# Patient Record
Sex: Female | Born: 1980 | Race: White | Hispanic: No | Marital: Married | Smoking: Former smoker
Health system: Southern US, Community
[De-identification: ages and names within clinical notes are randomized; demographics above are authoritative.]

## PROBLEM LIST (undated history)

## (undated) DIAGNOSIS — D649 Anemia, unspecified: Secondary | ICD-10-CM

## (undated) DIAGNOSIS — E039 Hypothyroidism, unspecified: Secondary | ICD-10-CM

## (undated) DIAGNOSIS — F419 Anxiety disorder, unspecified: Secondary | ICD-10-CM

## (undated) DIAGNOSIS — D563 Thalassemia minor: Secondary | ICD-10-CM

## (undated) HISTORY — PX: AUGMENTATION MAMMAPLASTY: SUR837

## (undated) HISTORY — DX: Anxiety disorder, unspecified: F41.9

## (undated) HISTORY — PX: BREAST SURGERY: SHX581

## (undated) HISTORY — DX: Anemia, unspecified: D64.9

---
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
## 2003-02-17 ENCOUNTER — Other Ambulatory Visit: Admission: RE | Admit: 2003-02-17 | Discharge: 2003-02-17 | Payer: Self-pay | Admitting: Obstetrics and Gynecology

## 2004-02-22 ENCOUNTER — Other Ambulatory Visit: Admission: RE | Admit: 2004-02-22 | Discharge: 2004-02-22 | Payer: Self-pay | Admitting: Obstetrics and Gynecology

## 2004-07-09 ENCOUNTER — Encounter: Admission: RE | Admit: 2004-07-09 | Discharge: 2004-07-09 | Payer: Self-pay | Admitting: Internal Medicine

## 2005-02-12 ENCOUNTER — Ambulatory Visit (HOSPITAL_COMMUNITY): Admission: RE | Admit: 2005-02-12 | Discharge: 2005-02-12 | Payer: Self-pay | Admitting: Obstetrics and Gynecology

## 2005-02-26 ENCOUNTER — Ambulatory Visit (HOSPITAL_COMMUNITY): Admission: RE | Admit: 2005-02-26 | Discharge: 2005-02-26 | Payer: Self-pay | Admitting: Obstetrics and Gynecology

## 2005-05-04 ENCOUNTER — Inpatient Hospital Stay (HOSPITAL_COMMUNITY): Admission: AD | Admit: 2005-05-04 | Discharge: 2005-05-06 | Payer: Self-pay | Admitting: Obstetrics and Gynecology

## 2005-06-12 ENCOUNTER — Other Ambulatory Visit: Admission: RE | Admit: 2005-06-12 | Discharge: 2005-06-12 | Payer: Self-pay | Admitting: Obstetrics and Gynecology

## 2005-08-08 ENCOUNTER — Ambulatory Visit: Admission: RE | Admit: 2005-08-08 | Discharge: 2005-08-08 | Payer: Self-pay | Admitting: Obstetrics and Gynecology

## 2006-06-19 ENCOUNTER — Other Ambulatory Visit: Admission: RE | Admit: 2006-06-19 | Discharge: 2006-06-19 | Payer: Self-pay | Admitting: Obstetrics and Gynecology

## 2007-04-10 ENCOUNTER — Ambulatory Visit (HOSPITAL_COMMUNITY): Admission: RE | Admit: 2007-04-10 | Discharge: 2007-04-10 | Payer: Self-pay | Admitting: Obstetrics and Gynecology

## 2007-11-11 ENCOUNTER — Inpatient Hospital Stay (HOSPITAL_COMMUNITY): Admission: AD | Admit: 2007-11-11 | Discharge: 2007-11-12 | Payer: Self-pay | Admitting: Obstetrics and Gynecology

## 2007-11-17 ENCOUNTER — Ambulatory Visit: Admission: RE | Admit: 2007-11-17 | Discharge: 2007-11-17 | Payer: Self-pay | Admitting: Obstetrics and Gynecology

## 2007-12-04 ENCOUNTER — Encounter: Admission: RE | Admit: 2007-12-04 | Discharge: 2007-12-04 | Payer: Self-pay | Admitting: Obstetrics and Gynecology

## 2008-08-18 ENCOUNTER — Ambulatory Visit: Payer: Self-pay | Admitting: Internal Medicine

## 2008-08-29 ENCOUNTER — Encounter: Admission: RE | Admit: 2008-08-29 | Discharge: 2008-08-29 | Payer: Self-pay | Admitting: Internal Medicine

## 2008-08-29 ENCOUNTER — Ambulatory Visit: Payer: Self-pay | Admitting: Internal Medicine

## 2008-09-19 ENCOUNTER — Ambulatory Visit: Payer: Self-pay | Admitting: Internal Medicine

## 2008-11-14 ENCOUNTER — Ambulatory Visit: Payer: Self-pay | Admitting: Internal Medicine

## 2008-11-25 ENCOUNTER — Ambulatory Visit: Payer: Self-pay | Admitting: Internal Medicine

## 2009-04-18 ENCOUNTER — Ambulatory Visit: Payer: Self-pay | Admitting: Internal Medicine

## 2009-04-25 ENCOUNTER — Ambulatory Visit: Payer: Self-pay | Admitting: Internal Medicine

## 2009-05-29 ENCOUNTER — Ambulatory Visit: Payer: Self-pay | Admitting: Internal Medicine

## 2009-08-30 ENCOUNTER — Encounter: Admission: RE | Admit: 2009-08-30 | Discharge: 2009-08-30 | Payer: Self-pay | Admitting: Obstetrics and Gynecology

## 2009-09-01 ENCOUNTER — Ambulatory Visit: Payer: Self-pay | Admitting: Internal Medicine

## 2009-12-01 ENCOUNTER — Ambulatory Visit: Payer: Self-pay | Admitting: Internal Medicine

## 2010-01-08 ENCOUNTER — Ambulatory Visit: Payer: Self-pay | Admitting: Internal Medicine

## 2010-02-08 ENCOUNTER — Ambulatory Visit: Payer: Self-pay | Admitting: Internal Medicine

## 2010-02-20 ENCOUNTER — Ambulatory Visit: Payer: Self-pay | Admitting: Internal Medicine

## 2010-03-08 ENCOUNTER — Ambulatory Visit: Payer: Self-pay | Admitting: Internal Medicine

## 2010-06-01 ENCOUNTER — Ambulatory Visit: Payer: Self-pay | Admitting: Internal Medicine

## 2010-07-23 ENCOUNTER — Ambulatory Visit: Payer: Self-pay | Admitting: Internal Medicine

## 2010-07-30 ENCOUNTER — Ambulatory Visit: Payer: Self-pay | Admitting: Internal Medicine

## 2010-10-15 ENCOUNTER — Ambulatory Visit
Admission: RE | Admit: 2010-10-15 | Discharge: 2010-10-15 | Payer: Self-pay | Source: Home / Self Care | Attending: Internal Medicine | Admitting: Internal Medicine

## 2010-10-17 ENCOUNTER — Ambulatory Visit (HOSPITAL_COMMUNITY)
Admission: RE | Admit: 2010-10-17 | Discharge: 2010-10-17 | Payer: Self-pay | Source: Home / Self Care | Attending: Internal Medicine | Admitting: Internal Medicine

## 2011-01-29 ENCOUNTER — Ambulatory Visit (INDEPENDENT_AMBULATORY_CARE_PROVIDER_SITE_OTHER): Payer: BC Managed Care – PPO | Admitting: Internal Medicine

## 2011-01-29 DIAGNOSIS — E039 Hypothyroidism, unspecified: Secondary | ICD-10-CM

## 2011-01-29 DIAGNOSIS — J069 Acute upper respiratory infection, unspecified: Secondary | ICD-10-CM

## 2011-02-12 NOTE — H&P (Signed)
NAMEARICKA, GOLDBERGER              ACCOUNT NO.:  [id number]   MEDICAL RECORD NO.:  [id number]          PATIENT TYPE:  INP   LOCATION:  9131                          FACILITY:  [facility]   PHYSICIAN:  Naima A. Dillard, M.D. DATE OF BIRTH:  [birth year]   DATE OF ADMISSION:  11/11/2007  DATE OF DISCHARGE:                              HISTORY & PHYSICAL   HISTORY OF PRESENT ILLNESS:  Ms. Heidt is a 30 year old, G2, P1-0-0-1 at  37-4/7 weeks who presented complaining of uterine contractions every 8-  10 minutes for several hours.  She denied leaking or bleeding and  reports positive fetal movement.  Her pregnancy has been remarkable for  hypothyroidism with the patient on Synthroid at 0.88 mcg daily, beta-  thalassemia trait with anemia and Group B Streptococcus negative.   PRENATAL LABORATORY DATA:  Blood type is A positive, Rh antibody  negative, VDRL nonreactive, rubella titer positive, hepatitis B surface  antigen negative.  HIV is nonreactive.  Cystic fibrosis testing is  negative.  GC and chlamydia cultures were declined in the first  trimester.  Pap smear was due in September 2008.  Hemoglobin upon  entering the practice was 10.7.  It was 8.9 at 27 weeks.  Glucola was  normal.  TSH was 2.221 and T4 was 1.04 which were both within normal  limits at 27 weeks.  She then had labs done again at her primary office  at 33 weeks.  A first trimester screening was done and was normal.  Quadruple screen was also normal.  AFP was normal.   PRENATAL COURSE:  The patient entered care at approximately 9 weeks.  She had an ultrasound that day which confirmed dating and gave an [facility] of  November 29, 2007.  She had been on Synthroid per Dr. Lenord Fellers.  Cystic  fibrosis testing was negative in a previous pregnancy.  She declined GC  and Chlamydia.  First trimester screen was normal.  She had some  questionable fluid leaking at 16 weeks.  Everything was fine.  She had  an ultrasound at 19 weeks showing normal  growth.  There was a fetal  heart arrhythmia skipping occasional beats.  She had a fetal  echocardiogram that was normal.  Her monthly TSHs were normal.  Hemoglobin upon entering the practice was 10.7.  At 27 weeks, it was  8.9.  TSH was normal and FT4 was normal.  She continued on iron twice a  day.  Her Glucola was normal.  She had ultrasound at 31 weeks showing  growth at the [facility] with fluid at the 15th percentile.  She  increased her fluids and fluid was rechecked at the following visit and  was within normal limits.  She had no further issues with fetal heart  rate or arrhythmias.  Group B Streptococcus culture was negative.  Pap  smear was normal in January.  The rest of her pregnancy was essentially  uncomplicated.   PAST OBSTETRICAL HISTORY:  In 2006, she had a vaginal-assisted vacuum  delivery of a female infant, weight 7 pounds 9 ounces at 38-2/7 weeks.  She was  in labor 20 hours.  She had a midline episiotomy in a prolonged  second stage.   PAST MEDICAL HISTORY:  She does have a history of beta-thalassemia trait  and is on iron.  She reports usual childhood illnesses.  She has been  diagnosed with hypothyroidism and has been on Synthroid at current dose  of 0.88 mcg.   PAST SURGICAL HISTORY:  Wisdom teeth removed in the past.  Her only  other hospitalization was for childbirth.   ALLERGIES:  No known drug allergies.   FAMILY HISTORY:  Her maternal grandmother and maternal grandfather,  paternal grandmother and paternal grandfather all had heart disease.  Her mother has hypertension.  Her maternal grandfather has COPD.  Her  maternal grandfather also has hypothyroidism.  Her mother has multiple  sclerosis.  Her paternal grandmother and paternal grandfather have  rheumatoid arthritis.  Her maternal grandfather had prostate cancer.  Paternal grandfather had pancreatic cancer.   GENETIC HISTORY:  Remarkable for the father of the baby having a murmur  and the  sister of the father of the baby having a murmur.   SOCIAL HISTORY:  The patient is married to the father of baby.  He is  involved and supportive.  His name is Niara Bunker.  The patient is  Caucasian of the [place] faith.  She is college educated and a  homemaker.  Her husband is also college educated.  He is an Art gallery manager.  She has been followed by the certified nurse midwife service at [facility].  She denies any alcohol, drug or tobacco use during this  pregnancy.   PHYSICAL EXAMINATION:  VITAL SIGNS:  Stable.  The patient is febrile.  HEENT:  Within normal limits.  LUNGS:  Breath sounds are clear.  HEART:  Regular rate and rhythm without murmurs.  BREASTS:  Soft and nontender.  ABDOMEN:  Fundal height is approximately 37 cm.  Estimated fetal weight  is 6-7 pounds.  Uterine contractions every 3-5 minutes of strong  quality.  Cervix is 7-8, 100%, vertex at a -1 station with bulging bag  of water.  Fetal heart rate is reactive with no decelerations.  EXTREMITIES:  Deep tender reflexes are 2+ without clonus.  There is a  trace edema noted.   IMPRESSION:  1. Intrauterine pregnancy at 37-4/7 weeks.  2. Active labor.  3. Negative Group B Streptococcus.  4. Beta-thalassemia trait with mild anemia.  5. Hypothyroidism on Synthroid.   PLAN:  1. Admit to birthing suite for consult with Dr. Jaymes Graff as      attending physician.  2. Routine certified nurse midwife orders.  3. The patient desires epidural.  The patient understands that      impending labor may prevent that from happening.      Renaldo Reel Emilee Hero, C.N.M.      Naima A. Normand Sloop, M.D.  Electronically Signed    VLL/MEDQ  D:  11/11/2007  T:  11/13/2007  Job:  [id number]

## 2011-02-13 ENCOUNTER — Telehealth: Payer: Self-pay

## 2011-02-13 NOTE — Telephone Encounter (Signed)
Patient states uri got better, but now has re-occurred. You last saw her May 1. Finished antibiotics, went to the beach last weekend and felt better. Now has drainage in back of throat, ears feel full and has slight cough. Using Claritin and Nyquil prn

## 2011-02-13 NOTE — Telephone Encounter (Signed)
See Friday if symptoms do not improve.

## 2011-02-13 NOTE — Telephone Encounter (Signed)
Patient informed ov Friday if no better

## 2011-02-15 NOTE — Discharge Summary (Signed)
NAMEJAYMES, REVELS              ACCOUNT NO.:  [id number]   MEDICAL RECORD NO.:  [id number]          PATIENT TYPE:  INP   LOCATION:  9142                          FACILITY:  [facility]   PHYSICIAN:  Crist Fat. Rivard, M.D. DATE OF BIRTH:  12/24/1980   DATE OF ADMISSION:  05/04/2005  DATE OF DISCHARGE:  05/06/2005                                 DISCHARGE SUMMARY   ADMISSION DIAGNOSES:  1.  Intrauterine pregnancy at 38-2/7 weeks.  2.  Early active labor.  3.  Negative Group B Strep.  4.  Beta thalassemia trait.   DISCHARGE DIAGNOSES:  1.  Prolonged second stage.  2.  Maternal exhaustion.   PROCEDURES:  1.  Midline episiotomy.  2.  Vacuum assisted vaginal delivery.  3.  Repair of midline episiotomy.  4.  Epidural anesthesia.   HOSPITAL COURSE:  Ms. Borrayo is a 30 year old gravida 1, para 0 at 38-2/7  weeks who presented with uterine contractions times several hours. She had  been seen in the office earlier the day with cervix at 3 cm.  Pregnancy had  been remarkable for (1) beta thalassemia trait, (2) fetal heart rate  arrhythmia and PAC's previously noted on tracing but these had now resolved.  On admission the cervix was 5 cm, 80%, vertex at minus 1 to 0 station.  Artificial rupture of membranes was accomplished by Dr. Pennie Rushing and an  epidural was placed.  The patient progressed to completely dilated at 11  A.M. with vertex at plus 3 station but there was no urge to push.  The  patient began at approximately 11:40 to have the urge, pushing began.  Patient became exhausted after a time and with prolonged second stage she  was offered vacuum assisted vaginal delivery. She did desire to proceed with  this and this was accomplished by Dr. Pennie Rushing.  Findings were a viable female  by the name of Kimiah Hibner, V, weight 7 pounds 9 ounces, Apgar's 9  and 9.  Infant was taken to the full term nursery. Mother was taken to  recovery room in good condition.  Midline episiotomy was repaired  by Dr.  Pennie Rushing under epidural and local.  By postpartum day #1 the patient was  doing well.  She was up ad lib.  Hemoglobin was 9.3, down from 10.3. Her  vital signs were stable.  Her physical examination was within normal limits.  She was breastfeeding.  By postpartum day #2 again her condition was good,  she was using pain medication with good benefit.  She was deemed to have  received full benefit from her hospital stay and was discharged home.   DISCHARGE INSTRUCTIONS:  Instructions were [facility] Handout.   DISCHARGE MEDICATIONS:  1.  Motrin 600 mg p.o. q.6h. PRN pain.  2.  Tylox one to two p.o. q.3-4h. PRN pain.  3.  Micronor one p.o. daily.   FOLLOW UP:  Follow up will occur in six weeks at [facility].       VLL/MEDQ  D:  05/06/2005  T:  05/06/2005  Job:  [id number]

## 2011-02-15 NOTE — H&P (Signed)
NAMEMARCHELE, DECOCK              ACCOUNT NO.:  [id number]   MEDICAL RECORD NO.:  [id number]          PATIENT TYPE:  INP   LOCATION:  9169                          [facility]ILITY[facility]  [facility]   PHYSICIAN:  Hal Morales, M.D.DATE OF BIRTH:  1981-04-30   DATE OF ADMISSION:  05/04/2005  DATE OF DISCHARGE:                                HISTORY & PHYSICAL   HISTORY OF PRESENT ILLNESS:  Susan Olson is a 30 year old gravida 1, para 0, at  38-2/7 weeks who presents with uterine contractions every 5-6 minutes for  several hours, and significant uterine contractions in the last 24 hours.  She was seen in the office on May 03, 2005 with cervix 3 cm, she was given  Ambien which she took late in the afternoon which allowed her to rest a  little bit. She also reports fetal movement. Pregnancy has been remarkable  for:   1.  Beta thalassemia trait.  2.  Fetal heart rate arrhythmia and PACs previously noted but now appear to      be resolved.  and not heard.  3.Anemia   PRENATAL LABS:  Blood type is A positive, Rh antibody negative. VDRL  nonreactive. Rubella titer positive. Hepatitis B surface antigen negative.  HIV was declined. Cystic fibrosis testing was negative. GC/Chlamydia  cultures were negative. Pap was normal in May. Hemoglobin upon entering the  practice was 9.7, and was 9.8 at 26 weeks. She had a work up for anemia  which documented beta thalassemia trait. Patient's husband's hemoglobin  electrophoresis was negative. Quadruple screen appears to have been  declined. Group B strep culture was negative at 36 weeks, and negative  GC/Chlamydia.   An estimated date of confinement of May 16, 2005 was established by last  menstrual period and was in agreement with ultrasound at approximately 18  weeks.   HISTORY OF PRESENT PREGNANCY:  The patient entered care at approximately 10  weeks. She did appear to have had a quadruple screen done but I do not seen  the result in her chart. She had an  ultrasound at 18-weeks showing normal  growth and development, and positive nasal bone noted. One hour Glucola was  normal. Hemoglobin at 26 weeks was 9.8. She had a fetal heart rate  irregularity noted at 26 weeks. She was sent to [facility] for  evaluation, and on follow up ultrasound at 26 weeks there were intermittent  PACs noted but no arrhythmia was audible. Fetal echo was obtained which was  normal. Anemia work up showed beta thalassemia trait. Her husband had a  hemoglobin electrophoresis that was negative. The rest of her pregnancy was  essentially uncomplicated. She did have ultrasound at approximately 37-weeks  that showed normal growth.   OBSTETRICAL HISTORY:  The patient is a primigravida.   PAST MEDICAL HISTORY:  She was on oral contraceptives until August of 2005.  Her last Pap smear was in May of 2005. She reports the usual childhood  illnesses. At age 15 she had a history of cystitis. Removal of wisdom teeth  in 1998. She was a previous smoker.   ALLERGIES:  No known drug allergies.   FAMILY HISTORY:  Maternal grandmother and maternal grandfather had heart  problems. Paternal grandmother, paternal grandfather had congestive heart  failure. Her mother has hypertension. Her maternal grandfather had asthma  and emphysema, also had thyroid problems. Her mother has multiple sclerosis.  Paternal grandparents have rheumatoid arthritis. Maternal grandfather had  prostate cancer, and pancreatic cancer, and is now deceased.   GENETIC HISTORY:  Remarkable for father of the baby having a heart murmur,  and father of the baby's sister having a heart murmur. Father of the baby's  aunt also had twins.   SOCIAL HISTORY:  The patient is married to the father of the baby. He is  involved and supportive, his name is Susan Olson. The patient is  Caucasian, of the [place] faith, she is college educated and is employed  as a Production designer, theatre/television/film. Her husband is also college educated and  employed in  Public relations account executive. She has been followed by the physician's service at [facility]. She denies any alcohol, drug or tobacco use during this  pregnancy.   PHYSICAL EXAMINATION:  VITAL SIGNS:  Stable, patient is afebrile.  HEENT:  Within normal limits.  LUNGS:  Breath sounds are clear.  HEART:  Regular rate and rhythm without murmur.  BREASTS:  Soft and nontender.  ABDOMEN:  Fundal height is approximately 38 cm, estimated fetal weight is 7  to 7.5 pounds. Uterine contractions are 5 to 6 minutes apart, moderate  quality.  CERVICAL EXAM:  Is 5 cm, 80%, vertex at -1 to 0 station with bulging bag of  water and positive bloody show noted.  EXTREMITIES:  Deep tendon reflexes are 2+ without clonus, there is trace  edema noted.   IMPRESSION:  1.  Intrauterine pregnancy at 38-2/7 weeks.  2.  Early active labor.  3.  Positive group B strep.  4.  Beta thalassemia anemia trait.   PLAN:  1.  Admit to birthing suite for consult with Dr. Dierdre Forth as      attending physician.  2.  Routine physician orders.  3.  The patient may desire epidural as labor progresses.       VLL/MEDQ  D:  05/04/2005  T:  05/04/2005  Job:  [id number]

## 2011-02-15 NOTE — Op Note (Signed)
Susan Olson, Susan Olson              ACCOUNT NO.:  [id number]   MEDICAL RECORD NO.:  [id number]          PATIENT TYPE:  INP   LOCATION:  9142                          [facility]ACILITY[facility]  [facility]   PHYSICIAN:  Hal Morales, M.D.DATE OF BIRTH:  24-Nov-1980   DATE OF PROCEDURE:  05/04/2005  DATE OF DISCHARGE:                                 OPERATIVE REPORT   PREOPERATIVE DIAGNOSES:  1.  Intrauterine pregnancy at term.  2.  Prolonged second stage.  3.  Maternal exhaustion.   POSTOPERATIVE DIAGNOSES:  1.  Intrauterine pregnancy at term.  2.  Prolonged second stage.  3.  Maternal exhaustion.   OPERATION:  Midline episiotomy, vacuum-assisted vaginal delivery, repair of  midline episiotomy.   SURGEON:  Hal Morales, M.D.   ANESTHESIA:  Epidural and local for repair of the episiotomy.   ESTIMATED BLOOD LOSS:  Less than 500 mL.   COMPLICATIONS:  None.   FINDINGS:  The patient was delivered of a female infant whose name is [name], IV, with Apgars of 9 and 9 at one and five minutes,  respectively.  The weight was pending at the time of dictation.   DESCRIPTION OF PROCEDURE:  The patient had been pushing for two hours, and  the vertex was at +4 station, occiput anterior, with approximately 4 cm of  caput showing without pushing. She admitted to the extreme exhaustion and  asked whether assistance could be given for her delivery.  A discussion was  held with the patient and her husband concerning the indications for vacuum-  assisted vaginal delivery.  The risks of damage to maternal tissues, fetal  caput, failure to deliver the fetal head, failure to deliver the fetal  shoulder after delivery of the fetal head with the subsequent need for  maneuvers for shoulder dystocia including Zavanelli maneuver were all  discussed with the patient.  After consideration she wished to proceed with  vacuum-assisted vaginal delivery.  She was thus placed in the lithotomy  position after  having been pushing in the [place] position.  The perineum was  cleansed, and the bladder emptied.  The perineum was draped and the  kiwi  vacuum extractor opened.  It was clear that because of the inflexibility of  the vaginal opening that the kiwi could not be placed and a second-degree  midline episiotomy was cut. The Kiwi was then placed over the fetal vertex.  Over the next two contractions with a combination of gentle traction with  the kiwi vacuum extractor and maternal expulsive efforts, the fetal vertex  was delivered.  The vacuum extractor suction was released between the two  contractions.  Once the head had been delivered and the pharynx suctioned,  the remainder of the infant was delivered with a combination of gentle  traction and maternal expulsive  efforts.  The cord was clamped and cut, and the infant placed on mother's  abdomen for bonding.  The placenta was removed with gentle traction.  The  midline episiotomy was closed in a layered fashion with 3-0 Vicryl suture.  An ice pack was placed, and the  patient was left in the labor/delivery  recovery area for initial bonding with her infant.       VPH/MEDQ  D:  05/04/2005  T:  05/05/2005  Job:  [id number]

## 2011-04-11 ENCOUNTER — Encounter: Payer: Self-pay | Admitting: Internal Medicine

## 2011-04-15 ENCOUNTER — Other Ambulatory Visit: Payer: BC Managed Care – PPO | Admitting: Internal Medicine

## 2011-04-15 DIAGNOSIS — E039 Hypothyroidism, unspecified: Secondary | ICD-10-CM

## 2011-04-15 LAB — TSH: TSH: 1.359 u[IU]/mL (ref 0.350–4.500)

## 2011-04-16 ENCOUNTER — Ambulatory Visit (INDEPENDENT_AMBULATORY_CARE_PROVIDER_SITE_OTHER): Payer: BC Managed Care – PPO | Admitting: Internal Medicine

## 2011-04-16 ENCOUNTER — Encounter: Payer: Self-pay | Admitting: Internal Medicine

## 2011-04-16 DIAGNOSIS — E039 Hypothyroidism, unspecified: Secondary | ICD-10-CM | POA: Insufficient documentation

## 2011-04-16 DIAGNOSIS — F411 Generalized anxiety disorder: Secondary | ICD-10-CM

## 2011-04-16 DIAGNOSIS — F419 Anxiety disorder, unspecified: Secondary | ICD-10-CM

## 2011-04-16 NOTE — Progress Notes (Signed)
  Subjective:    Patient ID: Susan Olson, female    DOB: 1981/06/18, 30 y.o.   MRN: [id number]  HPI  patient in today to followup on hypothyroidism. TSH drawn recently is entirely within normal limits. She thought maybe she would need an adjustment in dose of Synthroid but I see no reason to do that at this point in time. She thought she might want to have another child in the near future but has put that on hold for nail. She's currently on Micronor oral contraceptives. We talked about the sun and she may want to consider an IUD. She will discuss this with her GYN physician. Also she is going to have  breast augmentation in September.    Review of Systems     Objective:   Physical Exam  neck is supple without thyromegaly; chest clear; cardiac exam regular rate and rhythm        Assessment & Plan:  Hypothyroidism  History of anxiety  Planned breast augmentation September 2012 by Dr. Benna Dunks  Return in 6 months for followup on hypothyroidism

## 2011-04-16 NOTE — Patient Instructions (Signed)
Continue Synthroid at same dose. Return in 6 months

## 2011-05-31 ENCOUNTER — Other Ambulatory Visit: Payer: BC Managed Care – PPO | Admitting: Internal Medicine

## 2011-05-31 DIAGNOSIS — Z0000 Encounter for general adult medical examination without abnormal findings: Secondary | ICD-10-CM

## 2011-05-31 DIAGNOSIS — E039 Hypothyroidism, unspecified: Secondary | ICD-10-CM

## 2011-05-31 DIAGNOSIS — D509 Iron deficiency anemia, unspecified: Secondary | ICD-10-CM

## 2011-05-31 LAB — CBC
HCT: 33.4 % — ABNORMAL LOW (ref 36.0–46.0)
Hemoglobin: 10.4 g/dL — ABNORMAL LOW (ref 12.0–15.0)
MCH: 22 pg — ABNORMAL LOW (ref 26.0–34.0)
MCHC: 31.1 g/dL (ref 30.0–36.0)
MCV: 70.6 fL — ABNORMAL LOW (ref 78.0–100.0)
Platelets: 239 10*3/uL (ref 150–400)
RBC: 4.73 MIL/uL (ref 3.87–5.11)
RDW: 15.8 % — ABNORMAL HIGH (ref 11.5–15.5)
WBC: 5.3 10*3/uL (ref 4.0–10.5)

## 2011-05-31 LAB — LIPID PANEL
Cholesterol: 154 mg/dL (ref 0–200)
HDL: 77 mg/dL (ref >39)
LDL Cholesterol: 67 mg/dL (ref 0–99)
Total CHOL/HDL Ratio: 2 Ratio
Triglycerides: 50 mg/dL (ref <150)
VLDL: 10 mg/dL (ref 0–40)

## 2011-05-31 LAB — TSH: TSH: 2.188 u[IU]/mL (ref 0.350–4.500)

## 2011-06-04 ENCOUNTER — Other Ambulatory Visit: Payer: Self-pay

## 2011-06-04 MED ORDER — LEVOTHYROXINE SODIUM 125 MCG PO TABS: 125.0000 ug | ORAL_TABLET | Freq: Every day | ORAL | Status: DC

## 2011-06-21 LAB — CBC
HCT: 27.4 — ABNORMAL LOW
HCT: 33.4 — ABNORMAL LOW
Hemoglobin: 10.8 — ABNORMAL LOW
Hemoglobin: 8.9 — ABNORMAL LOW
MCHC: 32.4
MCHC: 32.7
MCV: 72.4 — ABNORMAL LOW
MCV: 72.8 — ABNORMAL LOW
Platelets: 224
Platelets: 279
RBC: 3.77 — ABNORMAL LOW
RBC: 4.61
RDW: 15.8 — ABNORMAL HIGH
RDW: 16.1 — ABNORMAL HIGH
WBC: 13.9 — ABNORMAL HIGH
WBC: 15.1 — ABNORMAL HIGH

## 2011-06-21 LAB — RPR: RPR Ser Ql: NONREACTIVE

## 2011-08-02 ENCOUNTER — Ambulatory Visit (INDEPENDENT_AMBULATORY_CARE_PROVIDER_SITE_OTHER): Payer: BC Managed Care – PPO | Admitting: Internal Medicine

## 2011-08-02 DIAGNOSIS — Z23 Encounter for immunization: Secondary | ICD-10-CM

## 2011-08-04 NOTE — Patient Instructions (Signed)
You have been given influenza immunization today. 

## 2011-08-20 ENCOUNTER — Encounter: Payer: Self-pay | Admitting: Internal Medicine

## 2011-08-20 ENCOUNTER — Ambulatory Visit (INDEPENDENT_AMBULATORY_CARE_PROVIDER_SITE_OTHER): Payer: BC Managed Care – PPO | Admitting: Internal Medicine

## 2011-08-20 ENCOUNTER — Other Ambulatory Visit: Payer: Self-pay | Admitting: Internal Medicine

## 2011-08-20 VITALS — BP 104/58 | HR 80 | Temp 99.9°F | Wt 136.5 lb

## 2011-08-20 DIAGNOSIS — K529 Noninfective gastroenteritis and colitis, unspecified: Secondary | ICD-10-CM

## 2011-08-20 DIAGNOSIS — R509 Fever, unspecified: Secondary | ICD-10-CM

## 2011-08-20 DIAGNOSIS — K5289 Other specified noninfective gastroenteritis and colitis: Secondary | ICD-10-CM

## 2011-08-20 DIAGNOSIS — N912 Amenorrhea, unspecified: Secondary | ICD-10-CM

## 2011-08-20 LAB — CBC WITH DIFFERENTIAL/PLATELET
HCT: 34.3 % — ABNORMAL LOW (ref 36.0–46.0)
Hemoglobin: 10.7 g/dL — ABNORMAL LOW (ref 12.0–15.0)
Lymphocytes Relative: 8 % — ABNORMAL LOW (ref 12–46)
Lymphs Abs: 0.5 10*3/uL — ABNORMAL LOW (ref 0.7–4.0)
MCH: 21.8 pg — ABNORMAL LOW (ref 26.0–34.0)
MCHC: 31.3 g/dL (ref 30.0–36.0)
MCV: 70.1 fL — ABNORMAL LOW (ref 78.0–100.0)
Monocytes Absolute: 0.2 10*3/uL (ref 0.1–1.0)
Monocytes Relative: 3 % (ref 3–12)
Neutro Abs: 5.5 10*3/uL (ref 1.7–7.7)
Neutrophils Relative %: 89 % — ABNORMAL HIGH (ref 43–77)
Platelets: 231 10*3/uL (ref 150–400)
RBC: 4.9 MIL/uL (ref 3.87–5.11)
RDW: 14.5 % (ref 11.5–15.5)
WBC: 6.2 10*3/uL (ref 4.0–10.5)

## 2011-08-20 LAB — HCG, SERUM, QUALITATIVE: Preg, Serum: NEGATIVE

## 2011-08-21 LAB — CBC WITH DIFFERENTIAL/PLATELET

## 2011-08-21 LAB — BASIC METABOLIC PANEL

## 2011-08-31 NOTE — Progress Notes (Signed)
  Subjective:    Patient ID: Susan Olson, female    DOB: 1981/02/23, 30 y.o.   MRN: [id number]  HPI patient has felt nauseated for several days and has had a couple of episodes of loose stools. No frank vomiting. No fever or shaking chills. This started after having a somewhat unusual heavy menstrual cycle. It also appeared to be a bit shorter than usual. Not exposed to any significant gastroenteritis symptoms that she is aware of with her family.    Review of Systems     Objective:   Physical Exam HEENT exam is unremarkable; chest clear; abdomen no hepatosplenomegaly masses or tenderness        Assessment & Plan:  Probable gastroenteritis  Plan: Phenergan 25 mg by mouth every 4 hours when necessary nausea. Clear liquids until nausea has resolved for 24 hours, then advance diet slowly. Check CBC and serum pregnancy test  Addendum: Has long-standing history of anemia likely thalassemia trait which is been previously evaluated. White blood cell count is within normal limits. Serum pregnancy test is negative.

## 2011-08-31 NOTE — Patient Instructions (Addendum)
Take Phenergan as needed for nausea. We have drawn a complete blood count and serum pregnancy test today and will notify you of results. Stay on clear liquids until nausea has resolved for 24 hours then advance diet slowly. Return if not better in 48-72 hours or sooner if worse

## 2011-10-08 ENCOUNTER — Telehealth: Payer: Self-pay | Admitting: Internal Medicine

## 2011-10-08 NOTE — Telephone Encounter (Signed)
She does not need a hormone panel. As long as she is having regular periods, she has no hormone issues. She may discuss with GYN but I do not see the need for a hormone panel in a healthy young female.

## 2011-10-08 NOTE — Telephone Encounter (Signed)
Patient informed, however she would like to have iron stores checked with tsh. Can we do this?

## 2011-10-11 ENCOUNTER — Ambulatory Visit: Payer: BC Managed Care – PPO | Admitting: Internal Medicine

## 2011-10-11 ENCOUNTER — Ambulatory Visit (INDEPENDENT_AMBULATORY_CARE_PROVIDER_SITE_OTHER): Payer: BC Managed Care – PPO | Admitting: Internal Medicine

## 2011-10-11 ENCOUNTER — Other Ambulatory Visit: Payer: BC Managed Care – PPO | Admitting: Internal Medicine

## 2011-10-11 ENCOUNTER — Encounter: Payer: Self-pay | Admitting: Internal Medicine

## 2011-10-11 DIAGNOSIS — E039 Hypothyroidism, unspecified: Secondary | ICD-10-CM

## 2011-10-11 DIAGNOSIS — R5381 Other malaise: Secondary | ICD-10-CM

## 2011-10-11 DIAGNOSIS — D563 Thalassemia minor: Secondary | ICD-10-CM

## 2011-10-11 DIAGNOSIS — R5383 Other fatigue: Secondary | ICD-10-CM

## 2011-10-11 LAB — COMPREHENSIVE METABOLIC PANEL
ALT: 11 U/L (ref 0–35)
AST: 15 U/L (ref 0–37)
Albumin: 4.8 g/dL (ref 3.5–5.2)
Alkaline Phosphatase: 39 U/L (ref 39–117)
BUN: 16 mg/dL (ref 6–23)
CO2: 27 mEq/L (ref 19–32)
Calcium: 9.4 mg/dL (ref 8.4–10.5)
Chloride: 104 mEq/L (ref 96–112)
Creat: 0.71 mg/dL (ref 0.50–1.10)
Glucose, Bld: 80 mg/dL (ref 70–99)
Potassium: 5 mEq/L (ref 3.5–5.3)
Sodium: 139 mEq/L (ref 135–145)
Total Bilirubin: 0.5 mg/dL (ref 0.3–1.2)
Total Protein: 6.7 g/dL (ref 6.0–8.3)

## 2011-10-11 LAB — IRON AND TIBC
%SAT: 23 % (ref 20–55)
Iron: 57 ug/dL (ref 42–145)
TIBC: 250 ug/dL (ref 250–470)
UIBC: 193 ug/dL (ref 125–400)

## 2011-10-11 NOTE — Progress Notes (Signed)
Addended by: Judy Pimple on: 10/11/2011 11:48 AM   Modules accepted: Orders

## 2011-10-11 NOTE — Patient Instructions (Signed)
Await laboratory results. We will advise you further once results are received. Return every 6 months or as needed.

## 2011-10-11 NOTE — Progress Notes (Signed)
  Subjective:    Patient ID: Susan Olson, female    DOB: [birth year], 31 y.o.   MRN: [id number]  HPI 31 year old white female in today to have lab work at her request. Has fatigue. Says "something is not right". Read somewhere she should have her testosterone level checked. Explained to her that I did not see a need to have that done at her age. She has no hirsutism. Having regular menstrual periods. History of mild anemia related to thalassemia trait. Patient request that serum iron be checked. Patient has a history of anxiety. History of hypothyroidism currently on thyroid replacement therapy. Medications reviewed.    Review of Systems     Objective:   Physical Exam spent 10 minutes speaking with patient about her problems. Not examined.        Assessment & Plan:  Fatigue  Anxiety  Plan: CBC with differential, iron iron-binding capacity, C. met, TSH, FSH.

## 2011-10-12 LAB — CBC WITH DIFFERENTIAL/PLATELET
Basophils Absolute: 0 10*3/uL (ref 0.0–0.1)
Basophils Relative: 1 % (ref 0–1)
Eosinophils Absolute: 0.1 10*3/uL (ref 0.0–0.7)
Eosinophils Relative: 1 % (ref 0–5)
HCT: 32.8 % — ABNORMAL LOW (ref 36.0–46.0)
Hemoglobin: 10.3 g/dL — ABNORMAL LOW (ref 12.0–15.0)
Lymphocytes Relative: 39 % (ref 12–46)
Lymphs Abs: 2.3 10*3/uL (ref 0.7–4.0)
MCH: 22 pg — ABNORMAL LOW (ref 26.0–34.0)
MCHC: 31.4 g/dL (ref 30.0–36.0)
MCV: 69.9 fL — ABNORMAL LOW (ref 78.0–100.0)
Monocytes Absolute: 0.6 10*3/uL (ref 0.1–1.0)
Monocytes Relative: 10 % (ref 3–12)
Neutro Abs: 2.9 10*3/uL (ref 1.7–7.7)
Neutrophils Relative %: 49 % (ref 43–77)
Platelets: 300 10*3/uL (ref 150–400)
RBC: 4.69 MIL/uL (ref 3.87–5.11)
RDW: 16.5 % — ABNORMAL HIGH (ref 11.5–15.5)
WBC: 5.9 10*3/uL (ref 4.0–10.5)

## 2011-10-12 LAB — VITAMIN D 25 HYDROXY (VIT D DEFICIENCY, FRACTURES): Vit D, 25-Hydroxy: 39 ng/mL (ref 30–89)

## 2011-10-12 LAB — FOLLICLE STIMULATING HORMONE: FSH: 7.6 m[IU]/mL

## 2011-10-14 ENCOUNTER — Other Ambulatory Visit: Payer: BC Managed Care – PPO | Admitting: Internal Medicine

## 2011-10-14 ENCOUNTER — Ambulatory Visit: Payer: BC Managed Care – PPO | Admitting: Internal Medicine

## 2011-10-14 ENCOUNTER — Ambulatory Visit (INDEPENDENT_AMBULATORY_CARE_PROVIDER_SITE_OTHER): Payer: BC Managed Care – PPO | Admitting: Internal Medicine

## 2011-10-14 ENCOUNTER — Encounter: Payer: Self-pay | Admitting: Internal Medicine

## 2011-10-14 DIAGNOSIS — I10 Essential (primary) hypertension: Secondary | ICD-10-CM

## 2011-10-14 DIAGNOSIS — R55 Syncope and collapse: Secondary | ICD-10-CM

## 2011-10-14 DIAGNOSIS — E611 Iron deficiency: Secondary | ICD-10-CM

## 2011-10-14 DIAGNOSIS — Z23 Encounter for immunization: Secondary | ICD-10-CM

## 2011-10-14 DIAGNOSIS — S0990XA Unspecified injury of head, initial encounter: Secondary | ICD-10-CM

## 2011-10-14 DIAGNOSIS — F419 Anxiety disorder, unspecified: Secondary | ICD-10-CM

## 2011-10-14 DIAGNOSIS — R635 Abnormal weight gain: Secondary | ICD-10-CM

## 2011-10-14 DIAGNOSIS — F411 Generalized anxiety disorder: Secondary | ICD-10-CM

## 2011-10-14 DIAGNOSIS — D509 Iron deficiency anemia, unspecified: Secondary | ICD-10-CM

## 2011-10-14 LAB — POCT URINALYSIS DIPSTICK
Bilirubin, UA: NEGATIVE
Blood, UA: NEGATIVE
Glucose, UA: NEGATIVE
Ketones, UA: NEGATIVE
Leukocytes, UA: NEGATIVE
Nitrite, UA: NEGATIVE
Protein, UA: NEGATIVE
Spec Grav, UA: 1.01
Urobilinogen, UA: NEGATIVE
pH, UA: 6.5

## 2011-10-14 LAB — TSH: TSH: 1.155 u[IU]/mL (ref 0.350–4.500)

## 2011-10-14 MED ORDER — PNEUMOCOCCAL VAC POLYVALENT 25 MCG/0.5ML IJ INJ: 0.5000 mL | INJECTION | Freq: Once | INTRAMUSCULAR | Status: DC

## 2011-10-14 NOTE — Patient Instructions (Signed)
Continue to take Tylenol or Advil as needed for headache or musculoskeletal pain. Call if symptoms worsen. Recheck TSH in 6 months. Recheck iron level in 6 months. Recommend iron supplement once a day.

## 2011-10-14 NOTE — Progress Notes (Signed)
  Subjective:    Patient ID: Susan Olson, female    DOB: Nov 10, 1980, 31 y.o.   MRN: [id number]  HPI Patient called the answering service midafternoon Sunday, January 13. I spoke with her personally. Says that she recently got a new puppy and had gates put up in her home. Apparently stumbled across the gate and fell onto the kitchen floor. She fell on right shoulder and right parietal skull area suffering laceration  to right lateral scalp. This occurred around 10 PM on January 12. Apparently had a brief syncopal episode as a result of this. When she came to,she had no incontinence of urine or or bowels. Was able to remember events prior to this accident. She went on to bed. On January 13, she complained of a mild headache and a little bit of "fuzziness "with her focusing". No memory issues however. Over the phone she was completely lucid, alert and oriented. We discussed whether or not she should go to the emergency department. She reminded me she had an appointment today to discuss weight gain. Advised Tylenol or Advil for headache. Advised ice to sore neck area. No dizziness or vertigo. Feels better today.    Review of Systems     Objective:   Physical Exam PERRLA; funduscopic exam shows this to be sharp and flat bilaterally with normal vasculature. TMs are clear. Extraocular movements are full. Pharynx is clear. Uvula is midline. Neck is supple. Full range of motion of upper extremities. Has a 1 inch laceration to right lateral scalp. No evidence of secondary infection. This laceration is superficial and should heal well. No hematoma around laceration. Deep tendon reflexes are 2+ and symmetrical in the upper and lower extremities. Cerebellar finger to nose testing is normal. Gait is normal. No nystagmus. Muscle strength is 5 over 5 in upper and lower extremities.        Assessment & Plan:  Head trauma secondary to fall  Superficial scalp laceration  Syncope related to  fall  Hypothyroidism   Weight gain of 5 pounds since July 2012  Mild iron deficiency  History of anemia/thalassemia trait  Anxiety  Plan: For now will hold off on CT scan. She will call if symptoms worsen. Apply peroxide and Neosporin to scalp laceration until healed. Recent iron level is low normal. Recommend iron supplement once a day. Recently has been running 10 miles a week. Is disturbed with 5 pound weight gain since July. Wanted hormone level check. FSH, C- met is within normal limits. CBC shows chronic  anemia with suspected thalassemia trait. TSH pending on Synthroid 0.125 mg daily.

## 2011-10-15 ENCOUNTER — Ambulatory Visit: Payer: BC Managed Care – PPO | Admitting: Internal Medicine

## 2011-10-16 ENCOUNTER — Encounter (HOSPITAL_COMMUNITY): Payer: Self-pay | Admitting: Emergency Medicine

## 2011-10-16 ENCOUNTER — Emergency Department (HOSPITAL_COMMUNITY)
Admission: EM | Admit: 2011-10-16 | Discharge: 2011-10-16 | Disposition: A | Discharge status: Home / Self Care | Payer: BC Managed Care – PPO | Attending: Emergency Medicine | Admitting: Emergency Medicine

## 2011-10-16 ENCOUNTER — Emergency Department (HOSPITAL_COMMUNITY): Payer: BC Managed Care – PPO

## 2011-10-16 DIAGNOSIS — W010XXA Fall on same level from slipping, tripping and stumbling without subsequent striking against object, initial encounter: Secondary | ICD-10-CM | POA: Insufficient documentation

## 2011-10-16 DIAGNOSIS — R51 Headache: Secondary | ICD-10-CM | POA: Insufficient documentation

## 2011-10-16 DIAGNOSIS — F0781 Postconcussional syndrome: Secondary | ICD-10-CM | POA: Insufficient documentation

## 2011-10-16 DIAGNOSIS — R42 Dizziness and giddiness: Secondary | ICD-10-CM | POA: Insufficient documentation

## 2011-10-16 NOTE — ED Notes (Signed)
RETURNED FROM CT SCAN

## 2011-10-16 NOTE — ED Notes (Signed)
Pt states she tripped over a baby gait Saturday night.  States she fell, hit her head, and was unresponsive.  Seen by PCP on Monday for same.  Reports increased dizziness and feeling lightheaded today.  Reports pain to neck, back of head, and headache.

## 2011-10-16 NOTE — ED Provider Notes (Signed)
History     CSN: [id number]  Arrival date & time 10/16/11  1952   First MD Initiated Contact with Patient 10/16/11 2119      Chief Complaint  Patient presents with  . Dizziness    (Consider location/radiation/quality/duration/timing/severity/associated sxs/prior treatment) HPI Comments: Patient tripped and fell, hitting her head on Sunday.  Was seen by her primary care physician on Monday.  Today.  Headache has become worse with slight dizziness.  Patient was instructed by her primary care physician to come to the emergency room for CT scan to rule out "brain injury."  The history is provided by the patient.    Past Medical History  Diagnosis Date  . Thyroid disease   . Anemia   . Anxiety     Past Surgical History  Procedure Date  . Wisdom tooth extraction 1998    No family history on file.  History  Substance Use Topics  . Smoking status: Former Smoker    Quit date: 04/15/2001  . Smokeless tobacco: Never Used  . Alcohol Use: Yes    OB History    Grav Para Term Preterm Abortions TAB SAB Ect Mult Living                  Review of Systems  Constitutional: Negative for fever.  HENT: Negative for ear pain, neck pain and ear discharge.   Eyes: Negative for visual disturbance.  Gastrointestinal: Negative for nausea.  Musculoskeletal: Negative for back pain.  Neurological: Positive for dizziness. Negative for weakness and numbness.    Allergies  Zithromax  Home Medications   Current Outpatient Rx  Name Route Sig Dispense Refill  . ACETAMINOPHEN 325 MG PO TABS Oral Take 650 mg by mouth every 6 (six) hours as needed. For pain    . LEVOTHYROXINE SODIUM 125 MCG PO TABS Oral Take 125 mcg by mouth daily.      BP 118/76  Pulse 67  Temp(Src) 98.1 F (36.7 C) (Oral)  Resp 20  SpO2 100%  LMP 10/08/2011  Physical Exam  Constitutional: She is oriented to person, place, and time. She appears well-developed and well-nourished.  HENT:  Head: Normocephalic.    Neck: Normal range of motion.  Cardiovascular: Normal rate.   Pulmonary/Chest: Effort normal.  Musculoskeletal: Normal range of motion.  Neurological: She is oriented to person, place, and time.  Skin: Skin is warm and dry.  Psychiatric: She has a normal mood and affect.    ED Course  Procedures (including critical care time)  Labs Reviewed - No data to display Ct Head Wo Contrast  10/16/2011  *RADIOLOGY REPORT*  Clinical Data:  Fall, headache  CT HEAD WITHOUT CONTRAST  Technique:  Contiguous axial images were obtained from the base of the skull through the vertex without contrast  Comparison:  None.  Findings:  The brain has a normal appearance without evidence for hemorrhage, acute infarction, hydrocephalus, or mass lesion.  There is no extra axial fluid collection.  The skull and paranasal sinuses are normal.  IMPRESSION: Normal CT of the head without contrast.  Original Report Authenticated By: Judie Petit. Ruel Favors, M.D.     1. Concussion syndrome     Will obtain head CT to rule out intracranial injury, most likely is post concussive syndrome  MDM  Minor head injury        Arman Filter, NP 10/16/11 2239  Arman Filter, NP 10/16/11 2332

## 2011-10-16 NOTE — ED Provider Notes (Signed)
Medical screening examination/treatment/procedure(s) were performed by non-physician practitioner and as supervising physician I was immediately available for consultation/collaboration.  Juliet Rude. Rubin Payor, MD 10/16/11 2348

## 2011-12-21 ENCOUNTER — Other Ambulatory Visit: Payer: Self-pay | Admitting: Internal Medicine

## 2012-02-04 ENCOUNTER — Ambulatory Visit (INDEPENDENT_AMBULATORY_CARE_PROVIDER_SITE_OTHER): Payer: BC Managed Care – PPO | Admitting: Internal Medicine

## 2012-02-04 ENCOUNTER — Encounter: Payer: Self-pay | Admitting: Internal Medicine

## 2012-02-04 VITALS — BP 96/66 | HR 76 | Temp 98.3°F

## 2012-02-04 DIAGNOSIS — J029 Acute pharyngitis, unspecified: Secondary | ICD-10-CM

## 2012-02-04 NOTE — Progress Notes (Signed)
  Subjective:    Patient ID: Susan Olson, female    DOB: 11-06-1980, 31 y.o.   MRN: [id number]  HPI recently her son was diagnosed with strep throat. His initial rapid strep screen was negative and a throat culture proved to be positive. Therefore, she has strep throat exposure. She has sore throat for several days, malaise and fatigue. She also has had an IUD placed recently and was concerned it might affect her thyroid functions. Do not think thyroid functions need to be checked with an IUD. It is more of an issue with oral contraceptives.    Review of Systems     Objective:   Physical Exam pharynx slightly injected without exudate. Rapid strep screen negative. TMs are slightly full bilaterally. Neck is supple without significant adenopathy. Chest clear.        Assessment & Plan:  Pharyngitis  Hypothyroidism  Plan: Zithromax Z-Pak take 2 tablets day one followed by 1 tablet days 2 through 5 with 1 refill. If not better in one week have prescription refilled. Throat culture is pending.

## 2012-02-04 NOTE — Patient Instructions (Signed)
Take Zithromax Z-Pak 2 tablets day one followed by 1 tablet days 2 through 5. If not better in one week have prescription refilled. Throat culture is pending.

## 2012-02-06 LAB — CULTURE, GROUP A STREP

## 2012-05-05 ENCOUNTER — Ambulatory Visit: Payer: BC Managed Care – PPO | Admitting: Internal Medicine

## 2012-05-07 ENCOUNTER — Ambulatory Visit (INDEPENDENT_AMBULATORY_CARE_PROVIDER_SITE_OTHER): Payer: BC Managed Care – PPO | Admitting: Internal Medicine

## 2012-05-07 VITALS — BP 110/80

## 2012-05-07 DIAGNOSIS — F419 Anxiety disorder, unspecified: Secondary | ICD-10-CM

## 2012-05-07 DIAGNOSIS — F458 Other somatoform disorders: Secondary | ICD-10-CM

## 2012-05-07 DIAGNOSIS — E039 Hypothyroidism, unspecified: Secondary | ICD-10-CM

## 2012-05-07 DIAGNOSIS — R09A2 Foreign body sensation, throat: Secondary | ICD-10-CM

## 2012-05-07 DIAGNOSIS — R718 Other abnormality of red blood cells: Secondary | ICD-10-CM

## 2012-05-07 DIAGNOSIS — F411 Generalized anxiety disorder: Secondary | ICD-10-CM

## 2012-05-07 DIAGNOSIS — F449 Dissociative and conversion disorder, unspecified: Secondary | ICD-10-CM

## 2012-05-08 ENCOUNTER — Telehealth: Payer: Self-pay | Admitting: Internal Medicine

## 2012-05-09 NOTE — Telephone Encounter (Signed)
Noted patient prescribed Lexapro 10 mg daily by GYN.

## 2012-05-11 ENCOUNTER — Telehealth: Payer: Self-pay

## 2012-05-11 NOTE — Telephone Encounter (Signed)
Patient calls today, basically stating the 2 medicines she's taking are making her feel anxious, jittery inside. Feels drunk on the Tranxene. Was actually taking some expired Wellbutrin she had. Per Dr. Lenord Fellers, refer to Dr. Patsy Lager. Phone number given.she is to try to stay on the medication till seen.

## 2012-05-30 ENCOUNTER — Encounter: Payer: Self-pay | Admitting: Internal Medicine

## 2012-05-30 DIAGNOSIS — R718 Other abnormality of red blood cells: Secondary | ICD-10-CM | POA: Insufficient documentation

## 2012-05-30 NOTE — Progress Notes (Signed)
  Subjective:    Patient ID: Susan Olson, female    DOB: [birth year], 31 y.o.   MRN: [id number]  HPI 31 year old white female married with small children comes in today complaining of discomfort in her throat present for several days. Feels that there is a tightness there constantly. It's almost as if someone is putting pressure on her anterior neck. No difficulty swallowing just persistent neck and throat discomfort. She's under some stress. 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 Feels at times she is overwhelmed. She has a history of hypothyroidism. Tends to think that her thyroid medication  is not dosed correctly when she has episodes of anxiety.    Review of Systems     Objective:   Physical Exam neck is supple without thyromegaly. HEENT exam: TMs and pharynx are clear. Chest is clear to auscultation. Cardiac exam: Regular rate and rhythm.        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 Patient reassured that this is not serious.   Hypothyroidism  Anxiety  Plan: Patient may need to consider counseling. GYN recently prescribed Lexapro 10 mg daily. She has not started it yet. Agree with this and she should try it.

## 2012-05-30 NOTE — Patient Instructions (Addendum)
Try Lexapro 10 mg daily for anxiety. Consider counseling for anxiety.

## 2012-08-17 ENCOUNTER — Encounter: Payer: Self-pay | Admitting: Internal Medicine

## 2012-08-17 ENCOUNTER — Other Ambulatory Visit: Payer: Self-pay

## 2012-08-17 ENCOUNTER — Ambulatory Visit (INDEPENDENT_AMBULATORY_CARE_PROVIDER_SITE_OTHER): Payer: BC Managed Care – PPO | Admitting: Internal Medicine

## 2012-08-17 VITALS — BP 108/70 | HR 60 | Temp 98.3°F | Wt 131.5 lb

## 2012-08-17 DIAGNOSIS — R202 Paresthesia of skin: Secondary | ICD-10-CM

## 2012-08-17 DIAGNOSIS — R209 Unspecified disturbances of skin sensation: Secondary | ICD-10-CM

## 2012-08-17 DIAGNOSIS — G5622 Lesion of ulnar nerve, left upper limb: Secondary | ICD-10-CM

## 2012-08-17 DIAGNOSIS — G5620 Lesion of ulnar nerve, unspecified upper limb: Secondary | ICD-10-CM

## 2012-08-17 DIAGNOSIS — Z23 Encounter for immunization: Secondary | ICD-10-CM

## 2012-08-17 MED ORDER — SYNTHROID 125 MCG PO TABS: 125.0000 ug | ORAL_TABLET | Freq: Every day | ORAL | Status: DC

## 2012-08-17 NOTE — Addendum Note (Signed)
Addended by: Judy Pimple on: 08/17/2012 12:35 PM   Modules accepted: Orders

## 2012-08-17 NOTE — Patient Instructions (Addendum)
Call back if paresthesias do not resolve over the next 3 or 4 weeks. May wear wrist splint left wrist. Watch position to see if paresthesias are related to position change.

## 2012-08-17 NOTE — Progress Notes (Signed)
  Subjective:    Patient ID: Susan Olson, female    DOB: 06-Jun-1981, 31 y.o.   MRN: [id number]  HPI 31 year old white female recently went on trip to [facility] and noticed some paresthesias left fourth and fifth finger and also anterior shin area when kneeling. She told her husband about it. His mother has multiple sclerosis so he was alarmed about these findings. Patient denies doing any excessive activity with left hand. In fact she is right-handed. She does exercise on a regular basis. Mainly feels a paresthesia in the right shin area when she's kneeling. It does resolve. It is not there constantly. Neither are the paresthesias in the left fourth and fifth fingers. She has a history of hypothyroidism but hasn't been compliant with medication on a daily basis. History of microcytosis thought to be secondary to thalassemia trait. History of anxiety.    Review of Systems     Objective:   Physical Exam sensation is intact left fourth and fifth fingers. Muscle strength is normal in the left hand. Sensation is intact right lower leg. Muscle strength is normal in the right lower extremity. Pulses are normal.        Assessment & Plan:  Left ulnar neuropathy  Paresthesia right shin-etiology unclear-suspect this is positional  Anxiety  Plan: Check B12 and folate, free T4 and TSH. Influenza immunization given today. May want to wear left wrist splint.

## 2012-08-18 ENCOUNTER — Telehealth: Payer: Self-pay | Admitting: Internal Medicine

## 2012-08-18 LAB — VITAMIN B12: Vitamin B-12: 390 pg/mL (ref 211–911)

## 2012-08-18 LAB — T4, FREE: Free T4: 1.11 ng/dL (ref 0.80–1.80)

## 2012-08-18 NOTE — Telephone Encounter (Signed)
Patient advised. Admits she has not been compliant with Synthroid and will take it daily now

## 2012-08-18 NOTE — Telephone Encounter (Signed)
I have printed results. She has not been compliant with thyroid replacement abd results confirm that. Her TSH is elevated reflecting missed doses. Start back on thyroid replacement and recheck in 3 months. NO adjustment at this time. Needs to be compliant with medication.

## 2012-08-19 LAB — FOLATE: Folate: 16.1 ng/mL (ref >5.4)

## 2012-11-24 ENCOUNTER — Ambulatory Visit (INDEPENDENT_AMBULATORY_CARE_PROVIDER_SITE_OTHER): Payer: BC Managed Care – PPO | Admitting: Internal Medicine

## 2012-11-24 ENCOUNTER — Encounter: Payer: Self-pay | Admitting: Internal Medicine

## 2012-11-24 VITALS — BP 112/66 | Temp 98.3°F | Wt 135.0 lb

## 2012-11-24 DIAGNOSIS — R5381 Other malaise: Secondary | ICD-10-CM

## 2012-11-24 DIAGNOSIS — H6590 Unspecified nonsuppurative otitis media, unspecified ear: Secondary | ICD-10-CM

## 2012-11-24 DIAGNOSIS — R51 Headache: Secondary | ICD-10-CM

## 2012-11-24 DIAGNOSIS — K120 Recurrent oral aphthae: Secondary | ICD-10-CM

## 2012-11-24 DIAGNOSIS — E039 Hypothyroidism, unspecified: Secondary | ICD-10-CM

## 2012-11-24 DIAGNOSIS — H6593 Unspecified nonsuppurative otitis media, bilateral: Secondary | ICD-10-CM

## 2012-11-24 LAB — T4, FREE: Free T4: 1.05 ng/dL (ref 0.80–1.80)

## 2012-11-24 NOTE — Patient Instructions (Addendum)
Take Zithromax Z-PAK as directed. Take ibuprofen 800 mg 3 times daily for headache.

## 2012-11-24 NOTE — Progress Notes (Signed)
  Subjective:    Patient ID: Susan Olson, female    DOB: 03/22/1981, 32 y.o.   MRN: [id number]  HPI 32 year old white female seen at [facility] on Friday, February 21 with sore throat, URI symptoms, ear pain, malaise fatigue and headache. Was placed on  Amoxicillin 875 mg twice daily and Flonase. Has not improved. Has sores along left lateral tongue that are painful. Ear still feels stuffy. She has headache she cannot get rid off. Has been taking Advil 200 mg at a time. History of hypothyroidism. Last thyroid functions checked mid-November. History of depression. 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    Review of Systems     Objective:   Physical Exam aphthous ulcers left lateral tongue. Pharynx not injected. Tonsils present without exudate. TMs are full bilaterally. Neck is supple without significant adenopathy. No thyromegaly. Chest clear.        Assessment & Plan:  Fatigue  Headache  History of hypothyroidism  Aphthous ulcers  Bilateral serous otitis media  Plan: TSH checked today. Zithromax Z-PAK take 2 tablets day one followed by 1 tablet days 2 through 5. Ibuprofen 800 mg 3 times a day as needed for headache #30. 25 minutes spent with patient

## 2013-03-31 ENCOUNTER — Other Ambulatory Visit: Payer: Self-pay | Admitting: Internal Medicine

## 2013-05-17 ENCOUNTER — Other Ambulatory Visit: Payer: BC Managed Care – PPO | Admitting: Internal Medicine

## 2013-05-17 ENCOUNTER — Other Ambulatory Visit: Payer: Self-pay | Admitting: Internal Medicine

## 2013-05-17 DIAGNOSIS — E039 Hypothyroidism, unspecified: Secondary | ICD-10-CM

## 2013-05-17 DIAGNOSIS — Z130 Encounter for screening for diseases of the blood and blood-forming organs and certain disorders involving the immune mechanism: Secondary | ICD-10-CM

## 2013-05-17 DIAGNOSIS — Z13220 Encounter for screening for lipoid disorders: Secondary | ICD-10-CM

## 2013-05-17 DIAGNOSIS — Z0000 Encounter for general adult medical examination without abnormal findings: Secondary | ICD-10-CM

## 2013-05-17 LAB — LIPID PANEL
HDL: 66 mg/dL (ref >39)
LDL Cholesterol: 62 mg/dL (ref 0–99)
Total CHOL/HDL Ratio: 2.3 Ratio
Triglycerides: 104 mg/dL (ref <150)

## 2013-05-17 LAB — TSH: TSH: 0.27 u[IU]/mL — ABNORMAL LOW (ref 0.350–4.500)

## 2013-05-17 LAB — CBC WITH DIFFERENTIAL/PLATELET
Eosinophils Absolute: 0.1 10*3/uL (ref 0.0–0.7)
Hemoglobin: 10.1 g/dL — ABNORMAL LOW (ref 12.0–15.0)
Lymphs Abs: 2.6 10*3/uL (ref 0.7–4.0)
MCH: 22 pg — ABNORMAL LOW (ref 26.0–34.0)
MCV: 69.1 fL — ABNORMAL LOW (ref 78.0–100.0)
Monocytes Relative: 9 % (ref 3–12)
Neutrophils Relative %: 38 % — ABNORMAL LOW (ref 43–77)
RBC: 4.6 MIL/uL (ref 3.87–5.11)

## 2013-05-17 LAB — COMPREHENSIVE METABOLIC PANEL
AST: 17 U/L (ref 0–37)
Albumin: 4.3 g/dL (ref 3.5–5.2)
Alkaline Phosphatase: 30 U/L — ABNORMAL LOW (ref 39–117)
BUN: 12 mg/dL (ref 6–23)
Creat: 0.67 mg/dL (ref 0.50–1.10)
Glucose, Bld: 83 mg/dL (ref 70–99)
Total Bilirubin: 0.7 mg/dL (ref 0.3–1.2)

## 2013-05-18 ENCOUNTER — Encounter: Payer: Self-pay | Admitting: Internal Medicine

## 2013-05-18 ENCOUNTER — Ambulatory Visit (INDEPENDENT_AMBULATORY_CARE_PROVIDER_SITE_OTHER): Payer: BC Managed Care – PPO | Admitting: Internal Medicine

## 2013-05-18 VITALS — BP 110/82 | HR 60 | Ht 65.0 in | Wt 134.0 lb

## 2013-05-18 DIAGNOSIS — F32A Depression, unspecified: Secondary | ICD-10-CM

## 2013-05-18 DIAGNOSIS — F341 Dysthymic disorder: Secondary | ICD-10-CM

## 2013-05-18 DIAGNOSIS — F329 Major depressive disorder, single episode, unspecified: Secondary | ICD-10-CM

## 2013-05-18 DIAGNOSIS — D563 Thalassemia minor: Secondary | ICD-10-CM

## 2013-05-18 DIAGNOSIS — Z0000 Encounter for general adult medical examination without abnormal findings: Secondary | ICD-10-CM

## 2013-05-18 DIAGNOSIS — E039 Hypothyroidism, unspecified: Secondary | ICD-10-CM

## 2013-05-18 LAB — POCT URINALYSIS DIPSTICK
Blood, UA: NEGATIVE
Ketones, UA: NEGATIVE
Protein, UA: NEGATIVE
Spec Grav, UA: 1.01
Urobilinogen, UA: NEGATIVE

## 2013-05-18 LAB — T4, FREE: Free T4: 1.05 ng/dL (ref 0.80–1.80)

## 2013-05-18 LAB — FOLLICLE STIMULATING HORMONE: FSH: 4.3 m[IU]/mL

## 2013-05-30 ENCOUNTER — Encounter: Payer: Self-pay | Admitting: Internal Medicine

## 2013-05-30 DIAGNOSIS — D563 Thalassemia minor: Secondary | ICD-10-CM | POA: Insufficient documentation

## 2013-05-30 NOTE — Patient Instructions (Addendum)
Continue same dose of Synthroid and return in 6 months. 

## 2013-05-30 NOTE — Progress Notes (Signed)
  Subjective:    Patient ID: Susan Olson, female    DOB: 10/16/1980, 32 y.o.   MRN: [id number]  HPI  32 year old white female in today for health maintenance exam. She has GYN physician. History of hypothyroidism, anxiety and depression. Always thinks thyroid medication needs adjustment in that she could feel better. 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 Husband has been having issues with panic disorder which is upsetting. He is self-employed in family business. 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 Patient has history of beta thalassemia trait with mild anemia and microcytosis. Was diagnosed with  Hashimoto's  thyroiditis in July 2009.  Patient intolerant of Zithromax apparently says it causes malaise and nausea. Patient has GYN physician.  Family history: Mother with history of hypertension. Father in good health. One brother in good health.  Social history: Married. Has a college degree. Does not smoke. Social alcohol consumption. 2 sons the first of born in 2006 [place] one born in 2009.  Patient was started on Wellbutrin in May 2011 for depression and anxiety but she currently is taking Lexapro 10 mg daily.    Review of Systems  Constitutional: Positive for fatigue.  HENT: Negative.   Eyes: Negative.   Respiratory: Negative.   Cardiovascular: Negative.   Gastrointestinal: Negative.   Endocrine:       Hypothyroidism status post thyroiditis 2009  Genitourinary: Negative.   Neurological: Negative.   Psychiatric/Behavioral:       History of anxiety depression       Objective:   Physical Exam  Vitals reviewed. Constitutional: She is oriented to person, place, and time. She appears well-developed and well-nourished. No distress.  HENT:  Head: Normocephalic and atraumatic.  Right Ear: External ear normal.  Left Ear: External ear normal.  Mouth/Throat: Oropharynx is clear and moist. No oropharyngeal exudate.  Eyes:  Conjunctivae and EOM are normal. Pupils are equal, round, and reactive to light. Right eye exhibits no discharge. Left eye exhibits no discharge. No scleral icterus.  Neck: Neck supple. No JVD present. No thyromegaly present.  Cardiovascular: Normal rate, regular rhythm, normal heart sounds and intact distal pulses.   No murmur heard. Pulmonary/Chest: Effort normal and breath sounds normal. No respiratory distress. She has no wheezes. She has no rales. She exhibits no tenderness.  Breasts normal female  Abdominal: Soft. Bowel sounds are normal. She exhibits no distension and no mass. There is no tenderness. There is no rebound and no guarding.  Genitourinary:  Deferred to GYN  Musculoskeletal: Normal range of motion. She exhibits no edema.  Lymphadenopathy:    She has no cervical adenopathy.  Neurological: She is alert and oriented to person, place, and time. She has normal reflexes. She displays normal reflexes. No cranial nerve deficit. Coordination normal.  Skin: Skin is warm and dry. No rash noted. She is not diaphoretic.  Psychiatric: She has a normal mood and affect. Her behavior is normal. Judgment and thought content normal.          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

## 2013-06-07 ENCOUNTER — Other Ambulatory Visit: Payer: Self-pay | Admitting: Internal Medicine

## 2013-07-15 ENCOUNTER — Other Ambulatory Visit: Payer: Self-pay | Admitting: *Deleted

## 2013-07-15 ENCOUNTER — Telehealth: Payer: Self-pay | Admitting: Internal Medicine

## 2013-07-15 MED ORDER — LEVOTHYROXINE SODIUM 112 MCG PO TABS: 112.0000 ug | ORAL_TABLET | Freq: Every day | ORAL | Status: DC

## 2013-07-15 NOTE — Telephone Encounter (Signed)
Received labs with thyroid functions done per her Ob Gyn MD. Tsh is 0.241. Per Dr. Lenord Fellers, will decrease dose to Synthroid 112 mcg daily and repeat in 6 weeks with an office visit.

## 2013-07-15 NOTE — Telephone Encounter (Signed)
TSH drawn August 2014 in this office and again in October 15 at GYN office shows TSH to be low. She is currently on Synthroid 0.125 mg daily. Looks like thyroid replacement dose should be reduced. We will reduce to Synthroid 0.112 mg daily and recheck here in this office in 6 weeks.

## 2013-07-16 NOTE — Telephone Encounter (Signed)
New rx for Synthroid sent to pharmacy

## 2013-07-26 ENCOUNTER — Telehealth: Payer: Self-pay | Admitting: Internal Medicine

## 2013-07-26 NOTE — Telephone Encounter (Signed)
This should be handled by GYN this time.

## 2013-07-26 NOTE — Telephone Encounter (Signed)
Notified patient to contact her GYN.  States they have drawn blood by not TSH.  Once again reiterated that Dr. Lenord Fellers advised to contact her GYN and they should follow her TSH during this pregnancy.  Patient finally verbalized understanding of the instructions provided.

## 2013-08-03 ENCOUNTER — Ambulatory Visit (INDEPENDENT_AMBULATORY_CARE_PROVIDER_SITE_OTHER): Payer: BC Managed Care – PPO | Admitting: Internal Medicine

## 2013-08-03 VITALS — BP 98/70 | HR 80

## 2013-08-03 DIAGNOSIS — D509 Iron deficiency anemia, unspecified: Secondary | ICD-10-CM

## 2013-08-03 DIAGNOSIS — E039 Hypothyroidism, unspecified: Secondary | ICD-10-CM

## 2013-08-03 DIAGNOSIS — Z3201 Encounter for pregnancy test, result positive: Secondary | ICD-10-CM

## 2013-08-03 DIAGNOSIS — R55 Syncope and collapse: Secondary | ICD-10-CM

## 2013-08-03 LAB — CBC WITH DIFFERENTIAL/PLATELET
Basophils Absolute: 0 10*3/uL (ref 0.0–0.1)
Basophils Relative: 1 % (ref 0–1)
Eosinophils Absolute: 0.1 10*3/uL (ref 0.0–0.7)
Hemoglobin: 10.6 g/dL — ABNORMAL LOW (ref 12.0–15.0)
Lymphocytes Relative: 42 % (ref 12–46)
Lymphs Abs: 2.8 10*3/uL (ref 0.7–4.0)
MCH: 22.2 pg — ABNORMAL LOW (ref 26.0–34.0)
MCHC: 32.6 g/dL (ref 30.0–36.0)
Monocytes Absolute: 0.6 10*3/uL (ref 0.1–1.0)
Monocytes Relative: 9 % (ref 3–12)
Neutro Abs: 3.1 10*3/uL (ref 1.7–7.7)
Neutrophils Relative %: 46 % (ref 43–77)
RDW: 16.4 % — ABNORMAL HIGH (ref 11.5–15.5)
WBC: 6.6 10*3/uL (ref 4.0–10.5)

## 2013-08-03 LAB — IRON AND TIBC
%SAT: 22 % (ref 20–55)
TIBC: 263 ug/dL (ref 250–470)
UIBC: 204 ug/dL (ref 125–400)

## 2013-08-03 LAB — COMPREHENSIVE METABOLIC PANEL
ALT: 9 U/L (ref 0–35)
AST: 14 U/L (ref 0–37)
Alkaline Phosphatase: 28 U/L — ABNORMAL LOW (ref 39–117)
BUN: 12 mg/dL (ref 6–23)
Calcium: 9.6 mg/dL (ref 8.4–10.5)
Chloride: 104 mEq/L (ref 96–112)
Glucose, Bld: 100 mg/dL — ABNORMAL HIGH (ref 70–99)
Potassium: 4.6 mEq/L (ref 3.5–5.3)
Sodium: 137 mEq/L (ref 135–145)
Total Bilirubin: 0.5 mg/dL (ref 0.3–1.2)

## 2013-08-03 LAB — TSH: TSH: 3.218 u[IU]/mL (ref 0.350–4.500)

## 2013-08-05 ENCOUNTER — Telehealth: Payer: Self-pay | Admitting: Internal Medicine

## 2013-08-05 MED ORDER — LEVOTHYROXINE SODIUM 125 MCG PO TABS: ORAL_TABLET | ORAL | Status: DC

## 2013-08-05 MED ORDER — LEVOTHYROXINE SODIUM 112 MCG PO TABS: ORAL_TABLET | ORAL | Status: DC

## 2013-08-05 NOTE — Telephone Encounter (Signed)
In October TSH was low and we reduced Synthroid to 0.112 mg daily. Now TSH is slightly over 3 and before it was in the 0.2 range on Synthroid 0.125 mg daily. I think it would be optimal to alternate 0.1-2 mg every other day with 0.125 mg every other day and recheck in 6 weeks. Patient will be contacted.

## 2013-08-09 NOTE — Telephone Encounter (Signed)
Erroneous entry

## 2013-08-17 ENCOUNTER — Ambulatory Visit (INDEPENDENT_AMBULATORY_CARE_PROVIDER_SITE_OTHER): Payer: BC Managed Care – PPO | Admitting: Internal Medicine

## 2013-08-17 ENCOUNTER — Encounter: Payer: Self-pay | Admitting: Internal Medicine

## 2013-08-17 VITALS — BP 102/60 | HR 68 | Temp 98.7°F | Ht 65.0 in | Wt 136.0 lb

## 2013-08-17 DIAGNOSIS — H9203 Otalgia, bilateral: Secondary | ICD-10-CM

## 2013-08-17 DIAGNOSIS — H9209 Otalgia, unspecified ear: Secondary | ICD-10-CM

## 2013-08-17 DIAGNOSIS — J029 Acute pharyngitis, unspecified: Secondary | ICD-10-CM

## 2013-08-17 MED ORDER — AZITHROMYCIN 250 MG PO TABS: ORAL_TABLET | ORAL | Status: DC

## 2013-08-17 NOTE — Patient Instructions (Signed)
Take Zithromax Z Pak. Call OB-GYN about Flu vaccine.

## 2013-08-17 NOTE — Progress Notes (Signed)
  Subjective:    Patient ID: Susan Olson, female    DOB: 10-20-1980, 32 y.o.   MRN: [id number]  HPI  Onset yesterday of sore throat and left ear pain. No fever or chills. Had been at soccer tournament all weekend. Had ultrasound last week at OB-GYN. Due apprx July 2nd. Is apprx 8 weeks pregnancy. Has been nauseated with this pregnancy.  Rapid strep screen is negative.     Review of Systems     Objective:   Physical Exam Left TM is full but not red or dull. Right TM is full and dull. Pharynx slightly injected. No exudate. Neck supple without adenopathy. Chest Clear.       Assessment & Plan:   BOM Pharyngitis  Plan Zithromax Z Pak Take 2 tabs day 1 then 1 tab days 2-5. Ask GYN  About flu vaccine. Patient asking about Tdap vaccine. Was done 2013

## 2013-08-18 ENCOUNTER — Encounter: Payer: Self-pay | Admitting: Internal Medicine

## 2013-08-18 NOTE — Progress Notes (Signed)
  Subjective:    Patient ID: Susan Olson, female    DOB: [birth year], 32 y.o.   MRN: [id number]  HPI 32 year old white female had an episode of syncope this morning shortly after arising from bed. Patient is in early stages of pregnancy. Has not yet seen GYN physician. Patient had no seizure activity and came to and could remember the accident. She has a history of hypothyroidism. History of beta thalassemia trait and microcytosis. History of anxiety. She had no vaginal bleeding with syncope. She has had nausea with this pregnancy but no vomiting.    Review of Systems     Objective:   Physical Exam blood pressure lying 104/62 with pulse of 60, standing blood pressure 98/70 pulse 80, sitting blood pressure 98/60 with pulse of 64. Pulse oximetry 99% on room air. Skin is warm and dry. She is alert and oriented x3. TMs and pharynx are clear. Neck is supple without thyromegaly. Chest clear to auscultation. Cardiac exam regular rate and rhythm. Abdomen no hepatosplenomegaly masses or tenderness. Brief neurological exam no focal deficits.        Assessment & Plan:  Syncope likely related to pregnancy  Hypothyroidism  Beta thalassemia trait with history of microcytosis and anemia  Plan: CBC, C. met, thyroid functions, iron iron-binding capacity will be drawn. Patient reassured. Stay well hydrated. Followup with OB/GYN. The treatment prescribed.  Addendum: See TSH results and note. We'll alternate every other day doses of Synthroid and recheck TSH in about 6 weeks. Aim to keep TSH at about 1.00

## 2013-08-18 NOTE — Patient Instructions (Signed)
Stay well hydrated. Get plenty of rest. Followup with OB/GYN physician. Labs are drawn and pending.

## 2013-08-30 ENCOUNTER — Encounter: Payer: Self-pay | Admitting: Internal Medicine

## 2013-08-30 ENCOUNTER — Other Ambulatory Visit: Payer: BC Managed Care – PPO | Admitting: Internal Medicine

## 2013-08-30 ENCOUNTER — Ambulatory Visit (INDEPENDENT_AMBULATORY_CARE_PROVIDER_SITE_OTHER): Payer: BC Managed Care – PPO | Admitting: Internal Medicine

## 2013-08-30 VITALS — BP 104/68 | HR 68 | Temp 98.9°F | Ht 64.0 in | Wt 138.0 lb

## 2013-08-30 DIAGNOSIS — E039 Hypothyroidism, unspecified: Secondary | ICD-10-CM

## 2013-08-30 DIAGNOSIS — J029 Acute pharyngitis, unspecified: Secondary | ICD-10-CM

## 2013-08-30 DIAGNOSIS — J069 Acute upper respiratory infection, unspecified: Secondary | ICD-10-CM

## 2013-08-31 ENCOUNTER — Ambulatory Visit: Payer: BC Managed Care – PPO | Admitting: Internal Medicine

## 2013-08-31 LAB — OB RESULTS CONSOLE ABO/RH: RH TYPE: POSITIVE

## 2013-08-31 LAB — OB RESULTS CONSOLE RUBELLA ANTIBODY, IGM: Rubella: IMMUNE

## 2013-08-31 LAB — OB RESULTS CONSOLE HIV ANTIBODY (ROUTINE TESTING): HIV: NONREACTIVE

## 2013-08-31 LAB — OB RESULTS CONSOLE HEPATITIS B SURFACE ANTIGEN: Hepatitis B Surface Ag: NEGATIVE

## 2013-08-31 LAB — OB RESULTS CONSOLE RPR: RPR: NONREACTIVE

## 2013-08-31 LAB — OB RESULTS CONSOLE ANTIBODY SCREEN: Antibody Screen: NEGATIVE

## 2013-09-16 ENCOUNTER — Other Ambulatory Visit: Payer: BC Managed Care – PPO | Admitting: Internal Medicine

## 2013-09-16 DIAGNOSIS — E039 Hypothyroidism, unspecified: Secondary | ICD-10-CM

## 2013-09-17 ENCOUNTER — Encounter: Payer: Self-pay | Admitting: Internal Medicine

## 2013-09-17 ENCOUNTER — Ambulatory Visit (INDEPENDENT_AMBULATORY_CARE_PROVIDER_SITE_OTHER): Payer: BC Managed Care – PPO | Admitting: Internal Medicine

## 2013-09-17 VITALS — BP 102/58 | HR 62 | Temp 97.3°F | Ht 65.0 in | Wt 138.0 lb

## 2013-09-17 DIAGNOSIS — E039 Hypothyroidism, unspecified: Secondary | ICD-10-CM

## 2013-09-17 MED ORDER — LEVOTHYROXINE SODIUM 125 MCG PO TABS: 125.0000 ug | ORAL_TABLET | Freq: Every day | ORAL | Status: DC

## 2013-09-17 MED ORDER — MUPIROCIN 2 % EX OINT: 1.0000 "application " | TOPICAL_OINTMENT | Freq: Two times a day (BID) | CUTANEOUS | Status: DC

## 2013-11-01 ENCOUNTER — Other Ambulatory Visit: Payer: BC Managed Care – PPO | Admitting: Internal Medicine

## 2013-11-01 DIAGNOSIS — E039 Hypothyroidism, unspecified: Secondary | ICD-10-CM

## 2013-11-01 LAB — TSH: TSH: 5.35 u[IU]/mL — ABNORMAL HIGH (ref 0.350–4.500)

## 2013-11-02 ENCOUNTER — Ambulatory Visit (INDEPENDENT_AMBULATORY_CARE_PROVIDER_SITE_OTHER): Payer: BC Managed Care – PPO | Admitting: Internal Medicine

## 2013-11-02 ENCOUNTER — Encounter: Payer: Self-pay | Admitting: Internal Medicine

## 2013-11-02 VITALS — BP 104/58 | HR 72 | Temp 97.9°F | Wt 146.0 lb

## 2013-11-02 DIAGNOSIS — E039 Hypothyroidism, unspecified: Secondary | ICD-10-CM

## 2013-11-02 MED ORDER — LEVOTHYROXINE SODIUM 137 MCG PO TABS: 137.0000 ug | ORAL_TABLET | Freq: Every day | ORAL | Status: DC

## 2013-11-02 NOTE — Patient Instructions (Addendum)
Increase Levothroid to 0.137 mg daily and return in 6 weeks. Take on empty stomach one hour before eating.

## 2013-11-02 NOTE — Progress Notes (Signed)
   Subjective:    Patient ID: Susan Olson, female    DOB: 1981/01/30, 33 y.o.   MRN: [id number]  HPI Pregnant with her third child. Is off antidepressants. May be slightly depressed. Says she short tempered. History of hypothyroidism. At last visit TSH was 4.089 and we increased Levothroid to 0.125 mg daily. Unfortunately TSH is now a bit higher at 5.350 despite increasing the dose at last visit. Is only waiting 30 minutes after taking thyroid replacement before eating. Needs  to wait 1 hour and not take thyroid replacement with any medication. Says she's not been taking it with medication. Morning sickness has resolved.    Review of Systems     Objective:   Physical Exam  Spent 15 minutes speaking with patient.      Assessment & Plan:  Hypothyroidism   Pregnancy  History of depression  Plan: Increase Levothroid to 0.137 mg daily. Return in 6 weeks for office visit and TSH.

## 2013-11-05 ENCOUNTER — Inpatient Hospital Stay (HOSPITAL_COMMUNITY)
Admission: AD | Admit: 2013-11-05 | Payer: BC Managed Care – PPO | Source: Ambulatory Visit | Admitting: Obstetrics and Gynecology

## 2013-11-15 ENCOUNTER — Other Ambulatory Visit: Payer: BC Managed Care – PPO | Admitting: Internal Medicine

## 2013-11-16 ENCOUNTER — Ambulatory Visit: Payer: BC Managed Care – PPO | Admitting: Internal Medicine

## 2013-11-28 NOTE — Patient Instructions (Addendum)
Take Zithromax Z-PAK as directed 2 tablets day one followed by 1 tablet days 2 through 5

## 2013-11-28 NOTE — Progress Notes (Signed)
   Subjective:    Patient ID: Susan Olson, female    DOB: 1981/08/12, 33 y.o.   MRN: [id number]  HPI  In today with acute URI symptoms and sore throat.  She has history of hypothyroidism. She is approximately [redacted] weeks pregnant. Due July 2.    Review of Systems     Objective:   Physical Exam pharynx slightly injected. TMs slightly full but not red. Neck is supple without adenopathy. Chest clear to auscultation.        Assessment & Plan:  Acute URI  Pharyngitis  Plan: Zithromax Z-Pak take 2 tablets day one followed by 1 tablet days 2 through 5

## 2013-12-13 ENCOUNTER — Other Ambulatory Visit: Payer: BC Managed Care – PPO | Admitting: Internal Medicine

## 2013-12-13 DIAGNOSIS — E039 Hypothyroidism, unspecified: Secondary | ICD-10-CM

## 2013-12-14 ENCOUNTER — Ambulatory Visit (INDEPENDENT_AMBULATORY_CARE_PROVIDER_SITE_OTHER): Payer: BC Managed Care – PPO | Admitting: Internal Medicine

## 2013-12-14 ENCOUNTER — Encounter: Payer: Self-pay | Admitting: Internal Medicine

## 2013-12-14 VITALS — BP 108/62 | HR 76 | Temp 97.9°F | Wt 150.0 lb

## 2013-12-14 DIAGNOSIS — F329 Major depressive disorder, single episode, unspecified: Secondary | ICD-10-CM

## 2013-12-14 DIAGNOSIS — Z3490 Encounter for supervision of normal pregnancy, unspecified, unspecified trimester: Secondary | ICD-10-CM

## 2013-12-14 DIAGNOSIS — Z331 Pregnant state, incidental: Secondary | ICD-10-CM

## 2013-12-14 DIAGNOSIS — F3289 Other specified depressive episodes: Secondary | ICD-10-CM

## 2013-12-14 DIAGNOSIS — F32A Depression, unspecified: Secondary | ICD-10-CM

## 2013-12-14 DIAGNOSIS — E039 Hypothyroidism, unspecified: Secondary | ICD-10-CM

## 2013-12-14 LAB — TSH: TSH: 2.139 u[IU]/mL (ref 0.350–4.500)

## 2013-12-14 NOTE — Patient Instructions (Signed)
Try to hold off starting SSRI medication such as Lexapro during the pregnancy and while breast-feeding. TSH is normal on current dose of thyroid replacement medication. Return in May for office visit and TSH.

## 2013-12-14 NOTE — Progress Notes (Signed)
   Subjective:    Patient ID: Susan Olson, female    DOB: [birth year], 33 y.o.   MRN: [id number]  HPI Says due date has been moved up to June 23. She's concerned about needing Lexapro. Had rough couple of weeks with snow and children at home. Her 34-year-old has attention deficit and is under the care of Dr. Shane [place]Gualtieri in [place]. She also has an 704-year-old. They are attending [employer]. She's trying to hold off on starting Lexapro. She's discussed this with obstetrician. TSH is now normal on current dose of thyroid replacement. She feels pretty well.    Review of Systems     Objective:   Physical Exam   Not examined. Spent 20 minutes between with patient about her family issues and how with attention deficit disorder. Thyroid is not enlarged     Assessment & Plan:  Hypothyroidism  Pregnancy  History of depression  Plan: Return in May  for office visit and TSH. Note due date is June 23 not July.

## 2013-12-14 NOTE — Progress Notes (Signed)
Has OV today

## 2014-02-04 ENCOUNTER — Other Ambulatory Visit: Payer: Self-pay | Admitting: Internal Medicine

## 2014-02-06 NOTE — Progress Notes (Signed)
   Subjective:    Patient ID: Susan Olson, female    DOB: 1980/12/08, 33 y.o.   MRN: [id number]  HPI In today to followup on hypothyroidism. She was seen in early November with a syncopal episode. TSH at that time was a bit elevated at 3.218. We decided to alternate 0.112 mg of levothyroxin was 0.125 mg of levothyroxin every other day. She's now here for followup. Due date is in July. No more episodes of syncope.    Review of Systems     Objective:   Physical Exam No thyromegaly. Feeling better. Less nausea.       Assessment & Plan:  Hypothyroidism  History of syncope while pregnant  Plan: TSH is elevated at 4.089. Increase Synthroid to 0.125 milligrams daily and recheck in 6 weeks.

## 2014-02-06 NOTE — Patient Instructions (Signed)
Increase Synthroid 0.125 mg daily and return in 6 weeks.

## 2014-02-15 ENCOUNTER — Other Ambulatory Visit: Payer: BC Managed Care – PPO | Admitting: Internal Medicine

## 2014-02-15 DIAGNOSIS — E039 Hypothyroidism, unspecified: Secondary | ICD-10-CM

## 2014-02-15 DIAGNOSIS — D649 Anemia, unspecified: Secondary | ICD-10-CM

## 2014-02-15 LAB — CBC WITH DIFFERENTIAL/PLATELET
Basophils Absolute: 0 10*3/uL (ref 0.0–0.1)
Basophils Relative: 0 % (ref 0–1)
EOS PCT: 1 % (ref 0–5)
Eosinophils Absolute: 0.1 10*3/uL (ref 0.0–0.7)
HEMATOCRIT: 27.6 % — AB (ref 36.0–46.0)
Hemoglobin: 9.1 g/dL — ABNORMAL LOW (ref 12.0–15.0)
LYMPHS ABS: 2 10*3/uL (ref 0.7–4.0)
LYMPHS PCT: 21 % (ref 12–46)
MCH: 23 pg — ABNORMAL LOW (ref 26.0–34.0)
MCHC: 33 g/dL (ref 30.0–36.0)
MCV: 69.9 fL — AB (ref 78.0–100.0)
Monocytes Absolute: 0.7 10*3/uL (ref 0.1–1.0)
Monocytes Relative: 7 % (ref 3–12)
Neutro Abs: 6.7 10*3/uL (ref 1.7–7.7)
Neutrophils Relative %: 71 % (ref 43–77)
PLATELETS: 272 10*3/uL (ref 150–400)
RBC: 3.95 MIL/uL (ref 3.87–5.11)
RDW: 16.5 % — ABNORMAL HIGH (ref 11.5–15.5)
WBC: 9.4 10*3/uL (ref 4.0–10.5)

## 2014-02-15 LAB — TSH: TSH: 1.413 u[IU]/mL (ref 0.350–4.500)

## 2014-02-15 LAB — IRON AND TIBC
%SAT: 31 % (ref 20–55)
IRON: 129 ug/dL (ref 42–145)
TIBC: 412 ug/dL (ref 250–470)
UIBC: 283 ug/dL (ref 125–400)

## 2014-02-17 ENCOUNTER — Ambulatory Visit (INDEPENDENT_AMBULATORY_CARE_PROVIDER_SITE_OTHER): Payer: BC Managed Care – PPO | Admitting: Internal Medicine

## 2014-02-17 ENCOUNTER — Encounter: Payer: Self-pay | Admitting: Internal Medicine

## 2014-02-17 VITALS — BP 102/68 | HR 68 | Wt 162.0 lb

## 2014-02-17 DIAGNOSIS — O99019 Anemia complicating pregnancy, unspecified trimester: Secondary | ICD-10-CM

## 2014-02-17 DIAGNOSIS — E039 Hypothyroidism, unspecified: Secondary | ICD-10-CM

## 2014-02-17 DIAGNOSIS — D649 Anemia, unspecified: Secondary | ICD-10-CM

## 2014-02-17 NOTE — Progress Notes (Signed)
   Subjective:    Patient ID: Susan Olson, female    DOB: 1981-02-21, 33 y.o.   MRN: [id number]  HPI Patient is in her third trimester of pregnancy and due around June 26. She's feeling well. Says it's a girl. This will be her third child. She's concerned because she was told recently she was anemic. Hemoglobin done recently at her request is 9.1 g. She has a history of thalassemia trait with microcytosis. She wanted her iron level checked and it is entirely within normal limits. She has a history of hypothyroidism. We've been adjusting doses of thyroid replacement throughout her pregnancy. Her TSH is very acceptable.    Review of Systems     Objective:   Physical Exam  Not examined spent 15 minutes speaking with patient about these issues.      Assessment & Plan:  Anemia of pregnancy  Hypothyroidism  Plan: 6 weeks after she delivers, she will contact us to have TSH rechecked. Otherwise continue with same dose of Synthroid 0.137 mg daily.

## 2014-02-17 NOTE — Patient Instructions (Signed)
Continue Synthroid 0.137 mg daily. Return 6 weeks after delivery.

## 2014-02-28 LAB — OB RESULTS CONSOLE GBS: GBS: NEGATIVE

## 2014-03-08 ENCOUNTER — Inpatient Hospital Stay (HOSPITAL_COMMUNITY)
Admission: AD | Admit: 2014-03-08 | Discharge: 2014-03-10 | DRG: 775 | Disposition: A | Discharge status: Home / Self Care | Payer: BC Managed Care – PPO | Source: Ambulatory Visit | Attending: Obstetrics and Gynecology | Admitting: Obstetrics and Gynecology

## 2014-03-08 ENCOUNTER — Encounter (HOSPITAL_COMMUNITY): Payer: Self-pay | Admitting: *Deleted

## 2014-03-08 ENCOUNTER — Encounter (HOSPITAL_COMMUNITY): Payer: BC Managed Care – PPO | Admitting: Anesthesiology

## 2014-03-08 ENCOUNTER — Inpatient Hospital Stay (HOSPITAL_COMMUNITY): Payer: BC Managed Care – PPO | Admitting: Anesthesiology

## 2014-03-08 DIAGNOSIS — O9902 Anemia complicating childbirth: Principal | ICD-10-CM | POA: Diagnosis present

## 2014-03-08 DIAGNOSIS — E039 Hypothyroidism, unspecified: Secondary | ICD-10-CM | POA: Diagnosis present

## 2014-03-08 DIAGNOSIS — E079 Disorder of thyroid, unspecified: Secondary | ICD-10-CM | POA: Diagnosis present

## 2014-03-08 DIAGNOSIS — Z87891 Personal history of nicotine dependence: Secondary | ICD-10-CM

## 2014-03-08 DIAGNOSIS — D649 Anemia, unspecified: Secondary | ICD-10-CM | POA: Diagnosis present

## 2014-03-08 DIAGNOSIS — O99284 Endocrine, nutritional and metabolic diseases complicating childbirth: Secondary | ICD-10-CM

## 2014-03-08 HISTORY — DX: Thalassemia minor: D56.3

## 2014-03-08 HISTORY — DX: Hypothyroidism, unspecified: E03.9

## 2014-03-08 LAB — CBC
HCT: 28.4 % — ABNORMAL LOW (ref 36.0–46.0)
Hemoglobin: 9.4 g/dL — ABNORMAL LOW (ref 12.0–15.0)
MCH: 23.8 pg — ABNORMAL LOW (ref 26.0–34.0)
MCHC: 33.1 g/dL (ref 30.0–36.0)
MCV: 71.9 fL — AB (ref 78.0–100.0)
PLATELETS: 271 10*3/uL (ref 150–400)
RBC: 3.95 MIL/uL (ref 3.87–5.11)
RDW: 16.6 % — AB (ref 11.5–15.5)
WBC: 11.4 10*3/uL — ABNORMAL HIGH (ref 4.0–10.5)

## 2014-03-08 LAB — RPR

## 2014-03-08 MED ORDER — FENTANYL 2.5 MCG/ML BUPIVACAINE 1/10 % EPIDURAL INFUSION (WH - ANES): 14.0000 mL/h | INTRAMUSCULAR | Status: DC | PRN

## 2014-03-08 MED ORDER — MEDROXYPROGESTERONE ACETATE 150 MG/ML IM SUSP: 150.0000 mg | INTRAMUSCULAR | Status: DC | PRN

## 2014-03-08 MED ORDER — ACETAMINOPHEN 325 MG PO TABS: 650.0000 mg | ORAL_TABLET | ORAL | Status: DC | PRN

## 2014-03-08 MED ORDER — ONDANSETRON HCL 4 MG PO TABS: 4.0000 mg | ORAL_TABLET | ORAL | Status: DC | PRN

## 2014-03-08 MED ORDER — LEVOTHYROXINE SODIUM 137 MCG PO TABS
137.0000 ug | ORAL_TABLET | Freq: Every day | ORAL | Status: DC
  Administered 2014-03-09 – 2014-03-10 (×2): 137 ug via ORAL
  Filled 2014-03-08 (×2): qty 1

## 2014-03-08 MED ORDER — ONDANSETRON HCL 4 MG/2ML IJ SOLN
4.0000 mg | INTRAMUSCULAR | Status: DC | PRN
Start: 2014-03-08 — End: 2014-03-10

## 2014-03-08 MED ORDER — EPHEDRINE 5 MG/ML INJ
10.0000 mg | INTRAVENOUS | Status: DC | PRN
  Filled 2014-03-08: qty 4
  Filled 2014-03-08: qty 2

## 2014-03-08 MED ORDER — IBUPROFEN 600 MG PO TABS: 600.0000 mg | ORAL_TABLET | Freq: Four times a day (QID) | ORAL | Status: DC | PRN

## 2014-03-08 MED ORDER — FENTANYL 2.5 MCG/ML BUPIVACAINE 1/10 % EPIDURAL INFUSION (WH - ANES)
14.0000 mL/h | INTRAMUSCULAR | Status: DC | PRN
  Administered 2014-03-08: 14 mL/h via EPIDURAL
  Filled 2014-03-08: qty 125

## 2014-03-08 MED ORDER — LACTATED RINGERS IV SOLN
500.0000 mL | Freq: Once | INTRAVENOUS | Status: AC
  Administered 2014-03-08: 1000 mL via INTRAVENOUS

## 2014-03-08 MED ORDER — LACTATED RINGERS IV SOLN: 500.0000 mL | INTRAVENOUS | Status: DC | PRN

## 2014-03-08 MED ORDER — OXYCODONE-ACETAMINOPHEN 5-325 MG PO TABS: 1.0000 | ORAL_TABLET | ORAL | Status: DC | PRN

## 2014-03-08 MED ORDER — PRENATAL MULTIVITAMIN CH
1.0000 | ORAL_TABLET | Freq: Every day | ORAL | Status: DC
  Administered 2014-03-09: 1 via ORAL
  Filled 2014-03-08: qty 1

## 2014-03-08 MED ORDER — LIDOCAINE HCL (PF) 1 % IJ SOLN
30.0000 mL | INTRAMUSCULAR | Status: DC | PRN
  Filled 2014-03-08: qty 30

## 2014-03-08 MED ORDER — TETANUS-DIPHTH-ACELL PERTUSSIS 5-2.5-18.5 LF-MCG/0.5 IM SUSP: 0.5000 mL | Freq: Once | INTRAMUSCULAR | Status: DC

## 2014-03-08 MED ORDER — LANOLIN HYDROUS EX OINT: TOPICAL_OINTMENT | CUTANEOUS | Status: DC | PRN

## 2014-03-08 MED ORDER — CITRIC ACID-SODIUM CITRATE 334-500 MG/5ML PO SOLN: 30.0000 mL | ORAL | Status: DC | PRN

## 2014-03-08 MED ORDER — SENNOSIDES-DOCUSATE SODIUM 8.6-50 MG PO TABS
2.0000 | ORAL_TABLET | ORAL | Status: DC
  Administered 2014-03-08 – 2014-03-10 (×2): 2 via ORAL
  Filled 2014-03-08 (×2): qty 2

## 2014-03-08 MED ORDER — WITCH HAZEL-GLYCERIN EX PADS: 1.0000 "application " | MEDICATED_PAD | CUTANEOUS | Status: DC | PRN

## 2014-03-08 MED ORDER — OXYTOCIN 40 UNITS IN LACTATED RINGERS INFUSION - SIMPLE MED
62.5000 mL/h | INTRAVENOUS | Status: DC
  Filled 2014-03-08: qty 1000

## 2014-03-08 MED ORDER — ONDANSETRON HCL 4 MG/2ML IJ SOLN: 4.0000 mg | Freq: Four times a day (QID) | INTRAMUSCULAR | Status: DC | PRN

## 2014-03-08 MED ORDER — SIMETHICONE 80 MG PO CHEW: 80.0000 mg | CHEWABLE_TABLET | ORAL | Status: DC | PRN

## 2014-03-08 MED ORDER — DIBUCAINE 1 % RE OINT
1.0000 "application " | TOPICAL_OINTMENT | RECTAL | Status: DC | PRN
  Administered 2014-03-08: 1 via RECTAL
  Filled 2014-03-08: qty 28

## 2014-03-08 MED ORDER — IBUPROFEN 600 MG PO TABS
600.0000 mg | ORAL_TABLET | Freq: Four times a day (QID) | ORAL | Status: DC
  Administered 2014-03-08 – 2014-03-10 (×7): 600 mg via ORAL
  Filled 2014-03-08 (×7): qty 1

## 2014-03-08 MED ORDER — BENZOCAINE-MENTHOL 20-0.5 % EX AERO
1.0000 | INHALATION_SPRAY | CUTANEOUS | Status: DC | PRN
Start: 2014-03-08 — End: 2014-03-10
  Administered 2014-03-08: 1 via TOPICAL
  Filled 2014-03-08: qty 56

## 2014-03-08 MED ORDER — EPHEDRINE 5 MG/ML INJ
10.0000 mg | INTRAVENOUS | Status: DC | PRN
  Filled 2014-03-08: qty 2

## 2014-03-08 MED ORDER — DIPHENHYDRAMINE HCL 50 MG/ML IJ SOLN: 12.5000 mg | INTRAMUSCULAR | Status: DC | PRN

## 2014-03-08 MED ORDER — OXYCODONE-ACETAMINOPHEN 5-325 MG PO TABS
1.0000 | ORAL_TABLET | ORAL | Status: DC | PRN
  Administered 2014-03-09 (×2): 1 via ORAL
  Filled 2014-03-08 (×3): qty 1

## 2014-03-08 MED ORDER — MEASLES, MUMPS & RUBELLA VAC ~~LOC~~ INJ
0.5000 mL | INJECTION | Freq: Once | SUBCUTANEOUS | Status: DC
  Filled 2014-03-08: qty 0.5

## 2014-03-08 MED ORDER — DIPHENHYDRAMINE HCL 25 MG PO CAPS: 25.0000 mg | ORAL_CAPSULE | Freq: Four times a day (QID) | ORAL | Status: DC | PRN

## 2014-03-08 MED ORDER — OXYTOCIN BOLUS FROM INFUSION: 500.0000 mL | INTRAVENOUS | Status: DC

## 2014-03-08 MED ORDER — SODIUM BICARBONATE 8.4 % IV SOLN
INTRAVENOUS | Status: DC | PRN
  Administered 2014-03-08: 5 mL via EPIDURAL

## 2014-03-08 MED ORDER — PHENYLEPHRINE 40 MCG/ML (10ML) SYRINGE FOR IV PUSH (FOR BLOOD PRESSURE SUPPORT)
80.0000 ug | PREFILLED_SYRINGE | INTRAVENOUS | Status: DC | PRN
  Filled 2014-03-08: qty 2

## 2014-03-08 MED ORDER — PHENYLEPHRINE 40 MCG/ML (10ML) SYRINGE FOR IV PUSH (FOR BLOOD PRESSURE SUPPORT)
80.0000 ug | PREFILLED_SYRINGE | INTRAVENOUS | Status: DC | PRN
  Filled 2014-03-08: qty 2
  Filled 2014-03-08: qty 10

## 2014-03-08 MED ORDER — LACTATED RINGERS IV SOLN
INTRAVENOUS | Status: DC
  Administered 2014-03-08: 09:00:00 via INTRAVENOUS

## 2014-03-08 NOTE — Progress Notes (Signed)
SVD of vigerous female infant w/ apgars of 8,9 .  Placenta delivered spontaneous w/ 3VC.   2nd degree midline episiotomy repaired w/ 3-0 vicryl rapide.  Fundus firm.  EBL 300cc .

## 2014-03-08 NOTE — MAU Note (Signed)
SROM @ 0500;

## 2014-03-08 NOTE — Anesthesia Preprocedure Evaluation (Addendum)
Anesthesia Evaluation  Patient identified by MRN, date of birth, ID band Patient awake    Reviewed: Allergy & Precautions, H&P , Patient's Chart, lab work & pertinent test results  Airway Mallampati: II  TM Distance: >3 FB Neck ROM: full    Dental  (+) Teeth Intact   Pulmonary former smoker,  breath sounds clear to auscultation        Cardiovascular Rhythm:regular Rate:Normal     Neuro/Psych    GI/Hepatic   Endo/Other    Renal/GU      Musculoskeletal   Abdominal   Peds  Hematology  (+) anemia ,   Anesthesia Other Findings       Reproductive/Obstetrics (+) Pregnancy                             Anesthesia Physical Anesthesia Plan  ASA: II  Anesthesia Plan: Epidural   Post-op Pain Management:    Induction:   Airway Management Planned:   Additional Equipment:   Intra-op Plan:   Post-operative Plan:   Informed Consent: I have reviewed the patients History and Physical, chart, labs and discussed the procedure including the risks, benefits and alternatives for the proposed anesthesia with the patient or authorized representative who has indicated his/her understanding and acceptance.   Dental Advisory Given  Plan Discussed with:   Anesthesia Plan Comments: (Labs checked- platelets confirmed with RN in room. Fetal heart tracing, per RN, reported to be stable enough for sitting procedure. Discussed epidural, and patient consents to the procedure:  included risk of possible headache,backache, failed block, allergic reaction, and nerve injury. This patient was asked if she had any questions or concerns before the procedure started.)        Anesthesia Quick Evaluation  

## 2014-03-08 NOTE — Anesthesia Procedure Notes (Signed)
Epidural Patient location during procedure: OB  Preanesthetic Checklist Completed: patient identified, site marked, surgical consent, pre-op evaluation, timeout performed, IV checked, risks and benefits discussed and monitors and equipment checked  Epidural Patient position: sitting Prep: site prepped and draped and DuraPrep Patient monitoring: continuous pulse ox and blood pressure Approach: midline Injection technique: LOR air  Needle:  Needle type: Tuohy  Needle gauge: 17 G Needle length: 9 cm and 9 Needle insertion depth: 4 cm Catheter type: closed end flexible Catheter size: 19 Gauge Catheter at skin depth: 9 cm Test dose: negative  Assessment Events: blood not aspirated, injection not painful, no injection resistance, negative IV test and no paresthesia  Additional Notes Dosing of Epidural:  1st dose, through catheter ............................................. epi 1:200K + Xylocaine 40 mg  2nd dose, through catheter, after waiting 3 minutes.....epi 1:200K + Xylocaine 60 mg    ( 2% Xylo charted as a single dose in Epic Meds for ease of charting; actual dosing was fractionated as above, for saftey's sake)  As each dose occurred, patient was free of IV sx; and patient exhibited no evidence of SA injection.  Patient is more comfortable after epidural dosed. Please see RN's note for documentation of vital signs,and FHR which are stable.  Patient reminded not to try to ambulate with numb legs, and that an RN must be present when she attempts to get up.       

## 2014-03-09 LAB — CBC
HCT: 27.1 % — ABNORMAL LOW (ref 36.0–46.0)
HEMOGLOBIN: 8.8 g/dL — AB (ref 12.0–15.0)
MCH: 23.5 pg — ABNORMAL LOW (ref 26.0–34.0)
MCHC: 32.5 g/dL (ref 30.0–36.0)
MCV: 72.5 fL — ABNORMAL LOW (ref 78.0–100.0)
Platelets: 212 10*3/uL (ref 150–400)
RBC: 3.74 MIL/uL — AB (ref 3.87–5.11)
RDW: 16 % — ABNORMAL HIGH (ref 11.5–15.5)
WBC: 12.7 10*3/uL — AB (ref 4.0–10.5)

## 2014-03-09 NOTE — Progress Notes (Signed)
Post Partum Day 1 Subjective: no complaints, up ad lib, voiding, tolerating PO and + flatus  Objective: Blood pressure 88/56, pulse 61, temperature 98.2 F (36.8 C), temperature source Oral, resp. rate 16, height 5\' 4"  (1.626 m), weight 162 lb (73.483 kg), SpO2 98.00%, unknown if currently breastfeeding.  Physical Exam:  General: alert and cooperative Lochia: appropriate Uterine Fundus: firm Incision: perineum intact DVT Evaluation: No evidence of DVT seen on physical exam. Negative Homan's sign. No cords or calf tenderness. No significant calf/ankle edema.   Recent Labs  03/08/14 0820 03/09/14 0630  HGB 9.4* 8.8*  HCT 28.4* 27.1*    Assessment/Plan: Plan for discharge tomorrow   LOS: 1 day   Steffi Noviello G 03/09/2014, 8:34 AM

## 2014-03-09 NOTE — Lactation Note (Signed)
This note was copied from the chart of Susan Olson. Lactation Consultation Note  Patient Name: Susan Racquelle Eales Today's Date: 03/09/2014 Reason for consult: Initial assessment;Late preterm infant Mom is experienced BF, last baby was born at 39 weeks. She has had breast augmentation since last baby. Mom reports baby is latching off and on. Reviewed late preterm behaviors with Mom. Baby awake at this visit, assisted Mom with latching baby in side lying position. Tried football and cross cradle 1st but baby had difficulty organizing her suck. In side lying baby demonstrated a good rhythmic suck, few swallows noted. Set Mom up with DEBP and encouraged to post pump and give baby any amount of EBM received. Mom hand expressed at this visit and received approx 1 ml of colostrum. Discussed with Mom the recommendation to supplement with feedings due to LPT to support calories and minimize weight loss. Advised to supplement with 10 ml of EBM since baby is now 38 hours old. Mom to pump on preemie setting after feedings. Lactation brochure left for review, advised of OP services and support group. Encouraged OP f/u for pre/post weight due to LPT status.   Maternal Data Formula Feeding for Exclusion: No Infant to breast within first hour of birth: Yes Has patient been taught Hand Expression?: Yes Does the patient have breastfeeding experience prior to this delivery?: Yes  Feeding Feeding Type: Breast Fed Length of feed: 15 min  LATCH Score/Interventions Latch: Repeated attempts needed to sustain latch, nipple held in mouth throughout feeding, stimulation needed to elicit sucking reflex. Intervention(s): Adjust position;Assist with latch;Breast massage;Breast compression  Audible Swallowing: A few with stimulation  Type of Nipple: Everted at rest and after stimulation  Comfort (Breast/Nipple): Soft / non-tender     Hold (Positioning): Assistance needed to correctly position infant at breast  and maintain latch.  LATCH Score: 7  Lactation Tools Discussed/Used Tools: Pump Breast pump type: Double-Electric Breast Pump WIC Program: No   Consult Status Consult Status: Follow-up Date: 03/10/14 Follow-up type: In-patient    Alfred Levins 03/09/2014, 4:08 PM

## 2014-03-09 NOTE — Anesthesia Postprocedure Evaluation (Signed)
Anesthesia Post Note  Patient: Susan Olson  Procedure(s) Performed: * No procedures listed *  Anesthesia type: Epidural  Patient location: Mother/Baby  Post pain: Pain level controlled  Post assessment: Post-op Vital signs reviewed  Last Vitals:  Filed Vitals:   03/09/14 0530  BP: 88/56  Pulse: 61  Temp: 36.8 C  Resp: 16    Post vital signs: Reviewed  Level of consciousness:alert  Complications: No apparent anesthesia complications

## 2014-03-09 NOTE — Addendum Note (Signed)
Addendum created 03/09/14 0916 by Graciela Husbands, CRNA   Modules edited: Charges VN, Notes Section   Notes Section:  File: [id number]

## 2014-03-10 MED ORDER — IBUPROFEN 600 MG PO TABS: 600.0000 mg | ORAL_TABLET | Freq: Four times a day (QID) | ORAL | Status: DC

## 2014-03-10 MED ORDER — OXYCODONE-ACETAMINOPHEN 5-325 MG PO TABS: 1.0000 | ORAL_TABLET | ORAL | Status: DC | PRN

## 2014-03-10 NOTE — Discharge Summary (Signed)
Obstetric Discharge Summary Reason for Admission: onset of labor Prenatal Procedures: ultrasound Intrapartum Procedures: spontaneous vaginal delivery Postpartum Procedures: none Complications-Operative and Postpartum: 2 degree perineal laceration Hemoglobin  Date Value Ref Range Status  03/09/2014 8.8* 12.0 - 15.0 g/dL Final     HCT  Date Value Ref Range Status  03/09/2014 27.1* 36.0 - 46.0 % Final    Physical Exam:  General: alert and cooperative Lochia: appropriate Uterine Fundus: firm Incision: perineum intact DVT Evaluation: No evidence of DVT seen on physical exam. Negative Homan's sign. No cords or calf tenderness. No significant calf/ankle edema.  Discharge Diagnoses: Term Pregnancy-delivered  Discharge Information: Date: 03/10/2014 Activity: pelvic rest Diet: routine Medications: PNV, Ibuprofen, Percocet and synthroid Condition: stable Instructions: refer to practice specific booklet Discharge to: home   Newborn Data: Live born female  Birth Weight: 6 lb 5.9 oz (2889 g) APGAR: 8, 9  Home with mother.  Susan Olson G 03/10/2014, 8:33 AM

## 2014-03-10 NOTE — Lactation Note (Signed)
This note was copied from the chart of Susan Olson. Lactation Consultation Note  Patient Name: Susan Olson Date: 03/10/2014 Reason for consult: Follow-up assessment;Late preterm infant;Infant < 6lbs Baby is very sleepy at this visit. Mom has pumped few times and given some small supplements. Baby now at 7% weight loss at 45 hours of age. Adequate void/stools. Mom experienced BF. Reviewed LPT behaviors and how they will run out of energy at about this hours of age. Encouraged Mom to pre-pump for 3-5 minutes to get her flow going for the baby and remove lower fat milk, BF on 1 breast for up to 30 minutes. Pump the other breast for 15-20 minutes and give baby back EBM. Advised Mom to supplement with 15-20 ml of EBM/formula if needed to protect calorie usage with BF and stabilize weight loss. Mom has DEBP at home. Other option is to BF each breast for 15-20 minutes, then post pump and supplement. Would continue this plan till weight check at [facility] on Saturday. OP f/u with lactation scheduled for Tuesday at 2:30, 03/15/14. Engorgement care reviewed.   Maternal Data    Feeding Feeding Type: Breast Fed Length of feed: 10 min (off and on)  LATCH Score/Interventions Latch: Grasps breast easily, tongue down, lips flanged, rhythmical sucking. Intervention(s): Adjust position;Assist with latch;Breast massage;Breast compression (to obtain more depth w/latch)  Audible Swallowing: A few with stimulation  Type of Nipple: Everted at rest and after stimulation  Comfort (Breast/Nipple): Soft / non-tender     Hold (Positioning): Assistance needed to correctly position infant at breast and maintain latch.  LATCH Score: 8  Lactation Tools Discussed/Used Tools: Pump Breast pump type: Double-Electric Breast Pump   Consult Status Consult Status: Complete Date: 03/10/14 Follow-up type: In-patient    Alfred Levins 03/10/2014, 10:55 AM

## 2014-03-10 NOTE — H&P (Signed)
Susan Olson is a 33 y.o. female presenting for SROM/labor. History OB History   Grav Para Term Preterm Abortions TAB SAB Ect Mult Living   3 3 2 1      3      Past Medical History  Diagnosis Date  . Anemia   . Anxiety   . Hypothyroid   . Thalassemia trait    Past Surgical History  Procedure Laterality Date  . Wisdom tooth extraction  1998  . Breast surgery     Family History: family history includes Arthritis in her father; Multiple sclerosis in her mother. Social History:  reports that she quit smoking about 12 years ago. She has never used smokeless tobacco. She reports that she drinks alcohol. She reports that she does not use illicit drugs.   Prenatal Transfer Tool  Maternal Diabetes: No Genetic Screening: Normal Maternal Ultrasounds/Referrals: Normal Fetal Ultrasounds or other Referrals:  None Maternal Substance Abuse:  No Significant Maternal Medications:  None Significant Maternal Lab Results:  None Other Comments:  None  ROS  Dilation: 10 Effacement (%): 100 Station: +2 Exam by:: Susan Olson rnc Blood pressure 99/67, pulse 65, temperature 98.1 F (36.7 C), temperature source Oral, resp. rate 18, height 5\' 4"  (1.626 Susan), weight 73.483 kg (162 lb), SpO2 99.00%, unknown if currently breastfeeding. Exam Physical Exam  Gen - uncomfortable w/ ctx Abd - gravid, NT Ext - NT Cvx 3-4cm on admission Prenatal labs: ABO, Rh: A/Positive/-- (12/02 0000) Antibody: Negative (12/02 0000) Rubella: Immune (12/02 0000) RPR: NON REAC (06/09 0820)  HBsAg: Negative (12/02 0000)  HIV: Non-reactive (12/02 0000)  GBS: Negative (06/01 1246)   Assessment/Plan: Labor Admit Epidural prn  Talvin Christianson 03/10/2014, 12:39 PM

## 2014-03-10 NOTE — Lactation Note (Signed)
This note was copied from the chart of Susan Levon Hedger. Lactation Consultation Note Had a 7% weight loss and 36 wk. 5 lbs. Encouraged mom to do longer feedings or more frequent feedings, to watch cues. Discussed LPI feedings and how they tire fast and require more frequent feedings or supplementing. Mom has DEBP and is pumping about 10 min. After feedings and getting around 3 ml of colostrum and giving it to baby. Mom experienced BF of 1 yr, for 2 older children.  Patient Name: Susan Olson [id number] Date: 03/10/2014     Maternal Data    Feeding Feeding Type: Breast Fed  LATCH Score/Interventions                      Lactation Tools Discussed/Used     Consult Status      Susan Olson 03/10/2014, 6:46 AM

## 2014-03-15 ENCOUNTER — Ambulatory Visit (HOSPITAL_COMMUNITY)
Admit: 2014-03-15 | Discharge: 2014-03-15 | Disposition: A | Discharge status: Home / Self Care | Payer: BC Managed Care – PPO | Attending: Obstetrics and Gynecology | Admitting: Obstetrics and Gynecology

## 2014-03-15 NOTE — Lactation Note (Signed)
Adult Lactation Consultation Outpatient Visit Note  Patient Name: Susan Olson      BABY: Almyra DeforestLARA Fowle Date of Birth: 1980-10-31                DOB: [birth year] Gestational Age at Delivery: 7962w5d BIRTH WEIGHT: 6-5 Type of Delivery: NVD                      DISCHARGE WEIGHT: 5-14                                                           WEIGHT TODAY: 6-7.1 Breastfeeding History: Frequency of Breastfeeding: EVERY 2-3 HOURS Length of Feeding:15 MINUTES ONE BREAST  Voids: 8+ Stools: 6+ YELLOW  Supplementing / Method:PRE PUMPS/BOTTLE PRN Pumping:  Type of Pump:MEDELA MANUAL AND DEBP   Frequency:PREPUMPS  Volume:  1-2 OUNCES  Comments:    Consultation Evaluation:Mom and 7 day infant here for feeding assessment.  Mom concerned that breasts feel too full.  She states she pre pumps for easier latch and the baby softens breast with feeding and is relaxed and content after feeding.  Baby is above birth weight and having great output.  Mom has pumped and bottle fed a few feedings.  Both breasts very full but not engorged.  Nipples intact.  Observed mom easily latch baby onto left breast.  Baby nursed actively with many audible swallows.  She did come off coughing a few times due to fast letdown.  Mom's breast softened well and baby transferred 72 mls.  Encouraged mom to wean off pre pumping and continue to feed with cues.  Reassured breast fullness at this point is very normal.  Answered questions and encouraged to call with concerns and attend breastfeeding support group when possible.  Initial Feeding Assessment:15 MINUTES Pre-feed [id number]:2924 Post-feed [id number]:1478Weight:2996 Amount Transferred:72 MLS Comments:  Additional Feeding Assessment: Pre-feed Weight: Post-feed Weight: Amount Transferred: Comments:  Additional Feeding Assessment: Pre-feed Weight: Post-feed Weight: Amount Transferred: Comments:  Total Breast milk Transferred this Visit: 72 MLS Total Supplement Given: NONE  Additional  Interventions:   Follow-Up SUPPORT GROUP AND WILL CALL PRN      Hansel Feinsteinowell, Mazzie Brodrick Ann 03/15/2014, 3:51 PM

## 2014-04-21 ENCOUNTER — Other Ambulatory Visit: Payer: BC Managed Care – PPO | Admitting: Internal Medicine

## 2014-04-21 DIAGNOSIS — E039 Hypothyroidism, unspecified: Secondary | ICD-10-CM

## 2014-04-22 ENCOUNTER — Other Ambulatory Visit: Payer: Self-pay

## 2014-04-22 LAB — TSH: TSH: 0.032 u[IU]/mL — ABNORMAL LOW (ref 0.350–4.500)

## 2014-04-22 MED ORDER — LEVOTHYROXINE SODIUM 125 MCG PO TABS: 125.0000 ug | ORAL_TABLET | Freq: Every day | ORAL | Status: DC

## 2014-04-22 NOTE — Telephone Encounter (Signed)
Patient informed of TSH, getting too much Synthroid. Per Dr. Lenord FellersBaxley, will decrease dose from 137mcg to 125mcg daily. Return in 8 weeks for OV and TSH.

## 2014-06-16 ENCOUNTER — Other Ambulatory Visit: Payer: BC Managed Care – PPO | Admitting: Internal Medicine

## 2014-06-17 ENCOUNTER — Ambulatory Visit: Payer: BC Managed Care – PPO | Admitting: Internal Medicine

## 2014-06-17 ENCOUNTER — Other Ambulatory Visit: Payer: BC Managed Care – PPO | Admitting: Internal Medicine

## 2014-06-17 DIAGNOSIS — E039 Hypothyroidism, unspecified: Secondary | ICD-10-CM

## 2014-06-18 LAB — TSH: TSH: 0.232 u[IU]/mL — ABNORMAL LOW (ref 0.350–4.500)

## 2014-06-20 ENCOUNTER — Ambulatory Visit (INDEPENDENT_AMBULATORY_CARE_PROVIDER_SITE_OTHER): Payer: BC Managed Care – PPO | Admitting: Internal Medicine

## 2014-06-20 ENCOUNTER — Encounter: Payer: Self-pay | Admitting: Internal Medicine

## 2014-06-20 VITALS — BP 110/70 | HR 60 | Ht 64.0 in | Wt 142.0 lb

## 2014-06-20 DIAGNOSIS — E039 Hypothyroidism, unspecified: Secondary | ICD-10-CM

## 2014-06-20 MED ORDER — LEVOTHYROXINE SODIUM 112 MCG PO TABS: 112.0000 ug | ORAL_TABLET | Freq: Every day | ORAL | Status: DC

## 2014-06-20 NOTE — Patient Instructions (Signed)
Decrease Synthroid to 0.112 mg daily and RTC in 6 weeks

## 2014-06-29 NOTE — Progress Notes (Signed)
   Subjective:    Patient ID: Susan Olson, female    DOB: 03/14/1981, 33 y.o.   MRN: [id number]  HPI  Followup of hypothyroidism status. She delivered a girl in June.Pecola Leisure. Baby is doing well. No complaints or problems. Was here in July for followup of hypothyroidism. TSH was low at that time. Had been on Synthroid 0.137 mg daily during pregnancy. Was decreased to Synthroid 0.125 mg daily in July.    Review of Systems     Objective:   Physical Exam  No thyromegaly. TSH is still low. See results.      Assessment & Plan:  Hypothyroidism  Plan: Decrease Synthroid to 0.112 mg daily and return in 6-8 weeks

## 2014-08-01 ENCOUNTER — Encounter: Payer: Self-pay | Admitting: Internal Medicine

## 2014-08-01 ENCOUNTER — Other Ambulatory Visit: Payer: BC Managed Care – PPO | Admitting: Internal Medicine

## 2014-08-01 DIAGNOSIS — E039 Hypothyroidism, unspecified: Secondary | ICD-10-CM

## 2014-08-01 LAB — TSH: TSH: 5.855 u[IU]/mL — ABNORMAL HIGH (ref 0.350–4.500)

## 2014-08-02 ENCOUNTER — Encounter: Payer: Self-pay | Admitting: Internal Medicine

## 2014-08-02 ENCOUNTER — Ambulatory Visit (INDEPENDENT_AMBULATORY_CARE_PROVIDER_SITE_OTHER): Payer: BC Managed Care – PPO | Admitting: Internal Medicine

## 2014-08-02 VITALS — BP 100/70 | HR 58 | Wt 143.0 lb

## 2014-08-02 DIAGNOSIS — R197 Diarrhea, unspecified: Secondary | ICD-10-CM

## 2014-08-02 DIAGNOSIS — E039 Hypothyroidism, unspecified: Secondary | ICD-10-CM

## 2014-08-02 DIAGNOSIS — R21 Rash and other nonspecific skin eruption: Secondary | ICD-10-CM

## 2014-08-02 NOTE — Patient Instructions (Signed)
Continue same medications for thyroid replacement and return in 2 weeks. Test for sprue will be done at that time. Use cream as directed on rash 3 times daily

## 2014-08-02 NOTE — Progress Notes (Signed)
   Subjective:    Patient ID: Susan Olson RecordsElizabeth G Linden, female    DOB: [birth year], 33 y.o.   MRN: [id number]  HPI  Here today to follow-up on hypothyroidism.in September TSH was low and we reduced her Synthroid to 0.112 mg daily. TSH is now elevated in the 5 range but she admits she's missed at least one dose of thyroid replacement recently. I'm not sure what to make of this reading. Were going to repeat it in 2 weeks and allow her to get back on 0.112 mg daily. She is taking it on an empty stomach. Also says she's getting diarrhea when eating wheat products. Think she may have a gluten sensitivity. Also has rash on abdomen and right thigh which is new since pregnancy. It is somewhat itchy. She relates that to a possible gluten intolerance.    Review of Systems     Objective:   Physical Exam   Fine papular rash on anterior abdomen and right thigh. No thyromegaly.     Assessment & Plan:  Hypothyroidism-needs to take Synthroid on a regular basis and return in 2 weeks for repeat TSH  Diarrhea- doubt she has gluten sensitivity. Could be irritable bowel syndrome. When she returns in 2 weeks to have TSH repeated she will have antigliadin antibodiesdrawn for celiac sprue  Nonspecific dermatitis? Eczema. Prescribed triamcinolone 0.1% 60 g in 4 ounces of uterine cream to apply to rash 3 times daily  25 minutes spent with patient

## 2014-08-15 ENCOUNTER — Other Ambulatory Visit (INDEPENDENT_AMBULATORY_CARE_PROVIDER_SITE_OTHER): Payer: BC Managed Care – PPO | Admitting: Internal Medicine

## 2014-08-15 DIAGNOSIS — Z23 Encounter for immunization: Secondary | ICD-10-CM

## 2014-08-15 DIAGNOSIS — R21 Rash and other nonspecific skin eruption: Secondary | ICD-10-CM

## 2014-08-15 DIAGNOSIS — E039 Hypothyroidism, unspecified: Secondary | ICD-10-CM

## 2014-08-15 LAB — TSH: TSH: 1.953 u[IU]/mL (ref 0.350–4.500)

## 2014-08-15 NOTE — Addendum Note (Signed)
Addended by: Judd GaudierLEVENS, Britiny Defrain M on: 08/15/2014 10:12 AM   Modules accepted: Orders

## 2014-08-16 ENCOUNTER — Telehealth: Payer: Self-pay

## 2014-08-16 LAB — GLIA (IGA/G) + TTG IGA
GLIADIN IGA: 13.6 U/mL (ref <20)
Gliadin IgG: 15.1 U/mL (ref <20)
Tissue Transglutaminase Ab, IgA: 5.4 U/mL (ref <20)

## 2014-08-16 NOTE — Telephone Encounter (Signed)
-----   Message from Margaree MackintoshMary J Baxley, MD sent at 08/16/2014 10:14 AM EST ----- I am pleased with TSH. Let's recheck in 3 months with OV for stability. Continue same dose of thyroid replacement.

## 2014-08-16 NOTE — Telephone Encounter (Signed)
Patient aware of TSH results and directions. 

## 2014-08-16 NOTE — Telephone Encounter (Signed)
-----   Message from Margaree MackintoshMary J Baxley, MD sent at 08/16/2014  2:25 PM EST ----- Celiac panel is normal. Please call pt.

## 2014-08-16 NOTE — Telephone Encounter (Signed)
Left message informing patient of normal celiac panel results.

## 2014-09-02 ENCOUNTER — Other Ambulatory Visit: Payer: Self-pay | Admitting: Internal Medicine

## 2015-02-11 ENCOUNTER — Other Ambulatory Visit: Payer: Self-pay | Admitting: Internal Medicine

## 2015-03-09 ENCOUNTER — Encounter: Payer: Self-pay | Admitting: Internal Medicine

## 2015-03-09 ENCOUNTER — Ambulatory Visit (INDEPENDENT_AMBULATORY_CARE_PROVIDER_SITE_OTHER): Payer: BLUE CROSS/BLUE SHIELD | Admitting: Internal Medicine

## 2015-03-09 VITALS — BP 110/72 | HR 50 | Temp 98.1°F | Wt 138.5 lb

## 2015-03-09 DIAGNOSIS — H6503 Acute serous otitis media, bilateral: Secondary | ICD-10-CM | POA: Diagnosis not present

## 2015-03-09 DIAGNOSIS — J029 Acute pharyngitis, unspecified: Secondary | ICD-10-CM | POA: Diagnosis not present

## 2015-03-09 DIAGNOSIS — J329 Chronic sinusitis, unspecified: Secondary | ICD-10-CM | POA: Diagnosis not present

## 2015-03-09 MED ORDER — AZITHROMYCIN 250 MG PO TABS: ORAL_TABLET | ORAL | Status: DC

## 2015-03-09 MED ORDER — METHYLPREDNISOLONE ACETATE 80 MG/ML IJ SUSP
80.0000 mg | Freq: Once | INTRAMUSCULAR | Status: AC
  Administered 2015-03-09: 80 mg via INTRAMUSCULAR

## 2015-03-09 NOTE — Patient Instructions (Addendum)
Depo-Medrol given IM today for nasal congestion. Take Zithromax Z-PAK as directed. Call if not better in 7-10 days or sooner if worse.

## 2015-03-09 NOTE — Progress Notes (Signed)
   Subjective:    Patient ID: Susan Olson, female    DOB: [birth year], 34 y.o.   MRN: [id number]  HPI  Onset last week respiratory congestion. No fever or shaking chills. Had significant sore throat. Went to [facility]  over the weekend and had strep test which was negative. No medications were prescribed. She's been taking Zyrtec and Sudafed and is still not better. She looks fatigued. She sounds nasally congested when she speaks. Possible strep exposure with young children.    Review of Systems see above History of hypothyroidism on thyroid replacement    Objective:   Physical Exam  Pharynx not injected. TMs are full bilaterally but not red. Neck is supple. Chest clear to auscultation. Sounds a bit hoarse and nasally congested      Assessment & Plan:  Acute sinusitis  Bilateral acute otitis media  Pharyngitis-negative rapid strep test at [facility]  Plan: Depo-Medrol IM. Zithromax Z-Pak 2 by mouth day one followed by 1 by mouth days 2 through 5

## 2015-04-28 ENCOUNTER — Other Ambulatory Visit: Payer: Self-pay | Admitting: Internal Medicine

## 2015-08-28 ENCOUNTER — Encounter: Payer: Self-pay | Admitting: Internal Medicine

## 2015-08-28 ENCOUNTER — Ambulatory Visit (INDEPENDENT_AMBULATORY_CARE_PROVIDER_SITE_OTHER): Payer: BLUE CROSS/BLUE SHIELD | Admitting: Internal Medicine

## 2015-08-28 VITALS — BP 106/68 | HR 54 | Temp 98.3°F | Resp 20 | Ht 64.0 in | Wt 139.0 lb

## 2015-08-28 DIAGNOSIS — Z23 Encounter for immunization: Secondary | ICD-10-CM | POA: Diagnosis not present

## 2015-08-28 MED ORDER — AZITHROMYCIN 250 MG PO TABS: ORAL_TABLET | ORAL | Status: DC

## 2015-08-28 NOTE — Patient Instructions (Signed)
Take Zithromax Z-Pak is prescribed. Call if not better in 10 days or sooner if worse.

## 2015-08-28 NOTE — Progress Notes (Signed)
   Subjective:    Patient ID: Susan Olson, female    DOB: 03-Mar-1981, 34 y.o.   MRN: [id number]  HPI Onset of sore throat symptoms Thanksgiving day. Her 3 children of had URI symptoms. No strep throat is been diagnosed the patient concerned about strep throat. Says it hurts to swallow fairly deep down in her neck area. Patient wonderes if she could've swallowed a pill incorrectly but she has not had pain with any swallowing of medication. No fever. No ear pain or significant cough.    Review of Systems     Objective:   Physical Exam  Pharynx slightly injected. Rapid strep screen is negative. Right TM clear. Left TM dull and injected centrally. Neck is supple without adenopathy or thyromegaly. Chest clear to auscultation.      Assessment & Plan:  Non-strep pharyngitis Hypothyroidism-thyroid nontender. Do not think she has thyroiditis.  Plan: Zithromax Z-Pak take 2 tablets day one followed by 1 tablet days 2 through 5.  Flu vaccine given

## 2015-08-29 LAB — POCT RAPID STREP A (OFFICE): RAPID STREP A SCREEN: NEGATIVE

## 2015-09-29 ENCOUNTER — Ambulatory Visit (INDEPENDENT_AMBULATORY_CARE_PROVIDER_SITE_OTHER): Payer: BLUE CROSS/BLUE SHIELD | Admitting: Internal Medicine

## 2015-09-29 ENCOUNTER — Encounter: Payer: Self-pay | Admitting: Internal Medicine

## 2015-09-29 VITALS — BP 112/70 | HR 58 | Temp 97.8°F | Resp 20 | Ht 64.0 in | Wt 137.0 lb

## 2015-09-29 DIAGNOSIS — M79642 Pain in left hand: Secondary | ICD-10-CM | POA: Diagnosis not present

## 2015-09-29 DIAGNOSIS — H6503 Acute serous otitis media, bilateral: Secondary | ICD-10-CM | POA: Diagnosis not present

## 2015-09-29 DIAGNOSIS — M7989 Other specified soft tissue disorders: Secondary | ICD-10-CM

## 2015-09-29 DIAGNOSIS — R718 Other abnormality of red blood cells: Secondary | ICD-10-CM

## 2015-09-29 DIAGNOSIS — M79641 Pain in right hand: Secondary | ICD-10-CM | POA: Diagnosis not present

## 2015-09-29 LAB — COMPLETE METABOLIC PANEL WITH GFR
ALT: 11 U/L (ref 6–29)
AST: 17 U/L (ref 10–30)
Albumin: 4.5 g/dL (ref 3.6–5.1)
Alkaline Phosphatase: 28 U/L — ABNORMAL LOW (ref 33–115)
BILIRUBIN TOTAL: 0.8 mg/dL (ref 0.2–1.2)
BUN: 19 mg/dL (ref 7–25)
CO2: 26 mmol/L (ref 20–31)
CREATININE: 0.7 mg/dL (ref 0.50–1.10)
Calcium: 9 mg/dL (ref 8.6–10.2)
Chloride: 101 mmol/L (ref 98–110)
GFR, Est Non African American: >89 mL/min (ref >=60)
GLUCOSE: 76 mg/dL (ref 65–99)
Potassium: 4.6 mmol/L (ref 3.5–5.3)
Sodium: 136 mmol/L (ref 135–146)
TOTAL PROTEIN: 6.7 g/dL (ref 6.1–8.1)

## 2015-09-29 LAB — CBC WITH DIFFERENTIAL/PLATELET
BASOS PCT: 1 % (ref 0–1)
Basophils Absolute: 0 10*3/uL (ref 0.0–0.1)
Eosinophils Absolute: 0.1 10*3/uL (ref 0.0–0.7)
Eosinophils Relative: 3 % (ref 0–5)
HCT: 32 % — ABNORMAL LOW (ref 36.0–46.0)
HEMOGLOBIN: 10.4 g/dL — AB (ref 12.0–15.0)
LYMPHS ABS: 1.9 10*3/uL (ref 0.7–4.0)
Lymphocytes Relative: 42 % (ref 12–46)
MCH: 22.7 pg — ABNORMAL LOW (ref 26.0–34.0)
MCHC: 32.5 g/dL (ref 30.0–36.0)
MCV: 69.9 fL — AB (ref 78.0–100.0)
MONOS PCT: 10 % (ref 3–12)
MPV: 8.5 fL — AB (ref 8.6–12.4)
Monocytes Absolute: 0.5 10*3/uL (ref 0.1–1.0)
NEUTROS ABS: 2 10*3/uL (ref 1.7–7.7)
Neutrophils Relative %: 44 % (ref 43–77)
Platelets: 277 10*3/uL (ref 150–400)
RBC: 4.58 MIL/uL (ref 3.87–5.11)
RDW: 16.9 % — ABNORMAL HIGH (ref 11.5–15.5)
WBC: 4.5 10*3/uL (ref 4.0–10.5)

## 2015-09-29 LAB — TSH: TSH: 1.596 u[IU]/mL (ref 0.350–4.500)

## 2015-09-29 LAB — RHEUMATOID FACTOR: Rhuematoid fact SerPl-aCnc: <10 IU/mL (ref <=14)

## 2015-09-29 MED ORDER — AZITHROMYCIN 250 MG PO TABS: ORAL_TABLET | ORAL | Status: AC

## 2015-09-29 NOTE — Progress Notes (Signed)
   Subjective:    Patient ID: Susan Olson, female    DOB: 11/06/1980, 34 y.o.   MRN: [id number]  HPI She's here today complaining of ear pain. 2 children have been sick with "walking pneumonia". She's not getting much rest. Slight sore throat. No fever or shaking chills.  She also brings up a new issue. Her father has seronegative rheumatoid arthritis. She's been having some bilateral hand discomfort  in the MCP joints and in her left thumb area. She will have rheumatology studies drawn today. We will check her TSH. She has a history of hypothyroidism and anxiety. Sometimes is hard for her to get her rings on and off her hands when seen slightly swollen. Wakes up at night with hand discomfort. Wonders if she's clenching her hands at night.      Review of Systems     Objective:   Physical Exam I don't see evidence of significant synovitis of hands today. No erythema or increased warmth of joints in hands. TMs are full bilaterally and pink. Pharynx slightly injected without exudate. Neck supple. Chest clear. No adenopathy. Rapid strep test not done.     Assessment & Plan:  Bilateral serous otitis media  Hand arthralgias  Plan: Patient will have CBC with differential, C met, TSH, CCP, rheumatoid factor, ANA and sedimentation rate drawn today. Treat with Zithromax Z-PAK 2 tablets day one followed by 1 tablet days 2 through 5 for serous otitis media. May need rheumatology referral.

## 2015-09-29 NOTE — Addendum Note (Signed)
Addended by: Margaree MackintoshBAXLEY, MARY J on: 09/29/2015 06:21 PM   Modules accepted: Orders

## 2015-09-29 NOTE — Patient Instructions (Signed)
Take Zithromax Z-PAK as directed. Rheumatology labs drawn and are pending. May need referral to rheumatologist.

## 2015-09-30 LAB — SEDIMENTATION RATE: Sed Rate: 1 mm/hr (ref 0–20)

## 2015-10-03 LAB — CYCLIC CITRUL PEPTIDE ANTIBODY, IGG: Cyclic Citrullin Peptide Ab: <16 Units

## 2015-10-03 LAB — ANA: Anti Nuclear Antibody(ANA): NEGATIVE

## 2016-01-25 ENCOUNTER — Ambulatory Visit
Admission: RE | Admit: 2016-01-25 | Discharge: 2016-01-25 | Disposition: A | Discharge status: Home / Self Care | Payer: BLUE CROSS/BLUE SHIELD | Source: Ambulatory Visit | Attending: Obstetrics and Gynecology | Admitting: Obstetrics and Gynecology

## 2016-01-25 ENCOUNTER — Other Ambulatory Visit: Payer: Self-pay | Admitting: Obstetrics and Gynecology

## 2016-01-25 DIAGNOSIS — N6310 Unspecified lump in the right breast, unspecified quadrant: Secondary | ICD-10-CM

## 2016-01-25 DIAGNOSIS — N63 Unspecified lump in unspecified breast: Secondary | ICD-10-CM

## 2019-10-18 ENCOUNTER — Ambulatory Visit: Payer: Self-pay | Attending: Internal Medicine

## 2019-10-18 DIAGNOSIS — Z20822 Contact with and (suspected) exposure to covid-19: Secondary | ICD-10-CM | POA: Insufficient documentation

## 2019-10-19 LAB — NOVEL CORONAVIRUS, NAA: SARS-CoV-2, NAA: NOT DETECTED

## 2020-08-15 ENCOUNTER — Other Ambulatory Visit: Payer: Self-pay | Admitting: Obstetrics and Gynecology

## 2020-08-15 DIAGNOSIS — N644 Mastodynia: Secondary | ICD-10-CM

## 2020-09-15 ENCOUNTER — Other Ambulatory Visit: Payer: Self-pay

## 2020-09-15 ENCOUNTER — Ambulatory Visit: Payer: Self-pay

## 2020-09-15 ENCOUNTER — Ambulatory Visit
Admission: RE | Admit: 2020-09-15 | Discharge: 2020-09-15 | Disposition: A | Discharge status: Home / Self Care | Payer: BC Managed Care – PPO | Source: Ambulatory Visit | Attending: Obstetrics and Gynecology | Admitting: Obstetrics and Gynecology

## 2020-09-15 DIAGNOSIS — N644 Mastodynia: Secondary | ICD-10-CM

## 2022-05-21 ENCOUNTER — Other Ambulatory Visit: Payer: Self-pay | Admitting: Obstetrics and Gynecology

## 2022-05-21 DIAGNOSIS — N6310 Unspecified lump in the right breast, unspecified quadrant: Secondary | ICD-10-CM

## 2022-05-27 ENCOUNTER — Ambulatory Visit
Admission: RE | Admit: 2022-05-27 | Discharge: 2022-05-27 | Disposition: A | Discharge status: Home / Self Care | Payer: BC Managed Care – PPO | Source: Ambulatory Visit | Attending: Obstetrics and Gynecology | Admitting: Obstetrics and Gynecology

## 2022-05-27 DIAGNOSIS — N6310 Unspecified lump in the right breast, unspecified quadrant: Secondary | ICD-10-CM

## 2022-09-11 IMAGING — MG MM  DIGITAL DIAGNOSTIC BREAST BILAT IMPLANT W/ TOMO W/ CAD
9 of 12 series · 9 of 28 positions shown · non-contrast
Comparison: Previous exam(s).

CLINICAL DATA: Patient describes bilateral breast pain/burning
radiating from the upper to lower breasts bilaterally.

EXAM:
DIGITAL DIAGNOSTIC BILATERAL MAMMOGRAM WITH IMPLANTS, CAD AND TOMO
The patient has retropectoral implants. Standard and implant
displaced views were performed.

[L MLO]
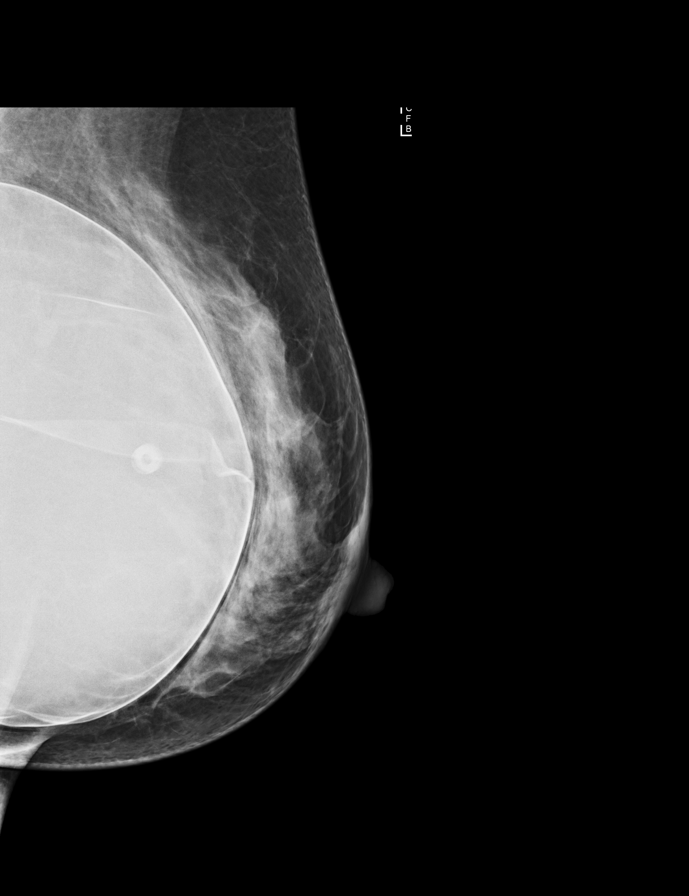

[R MLO]
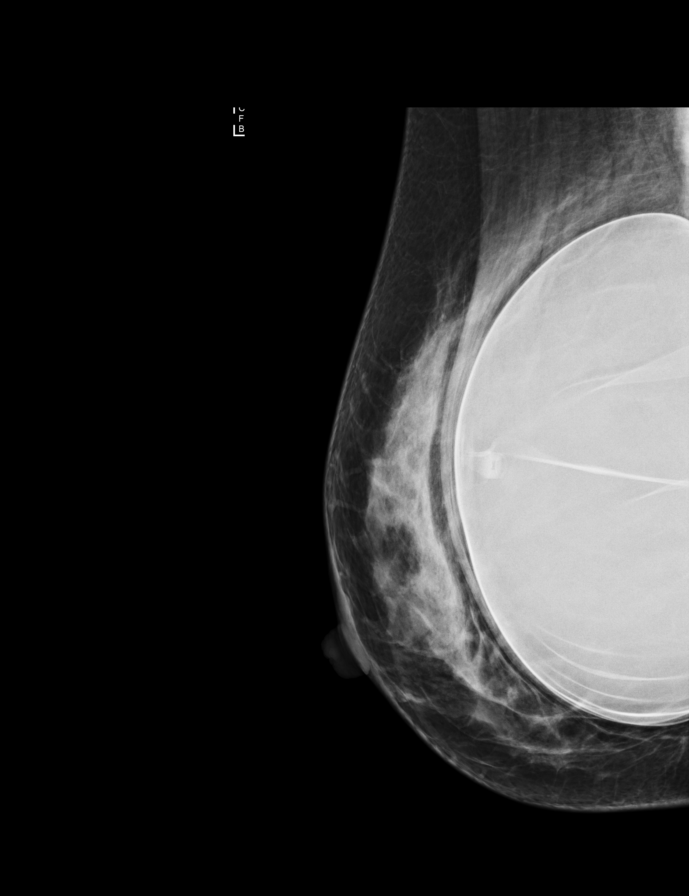

[R CC]
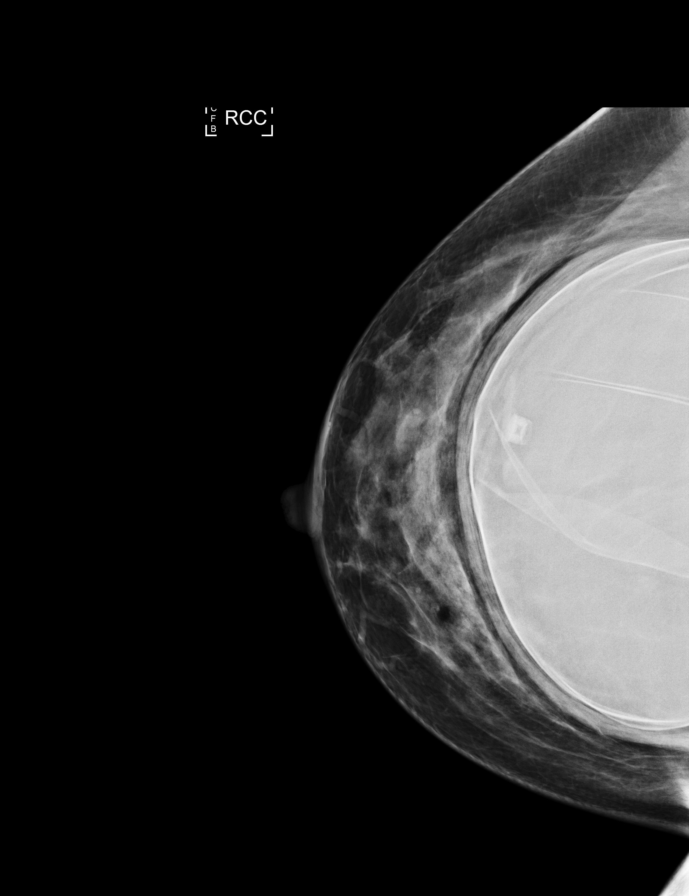

[L CC]
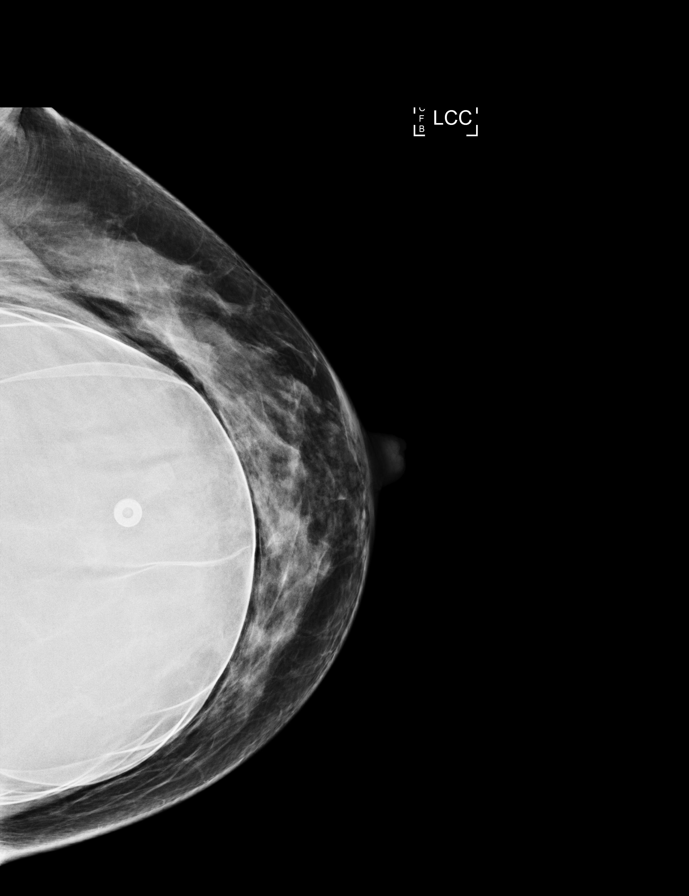

[R CC synth-2D]
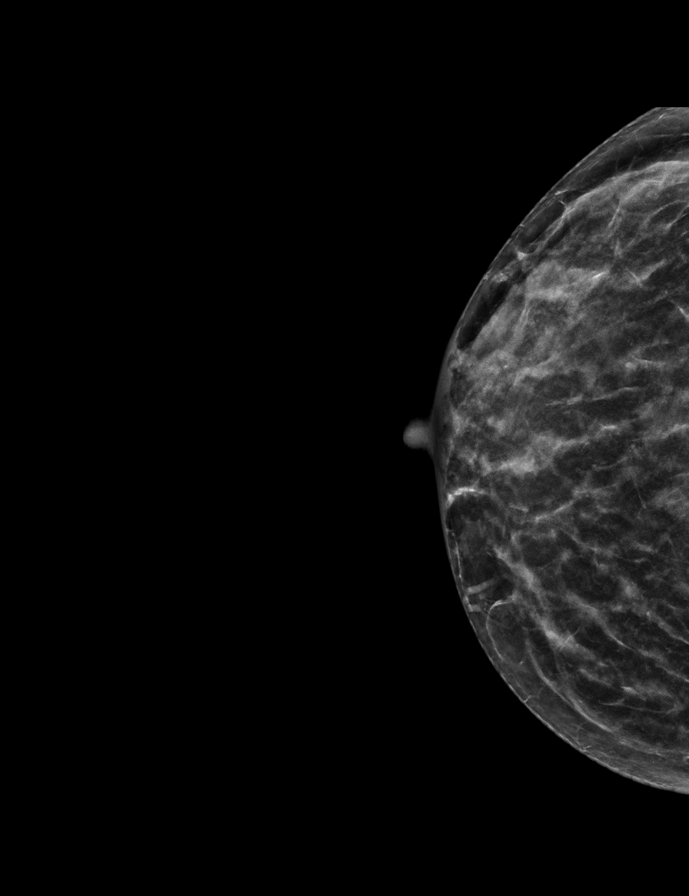

[R MLO synth-2D]
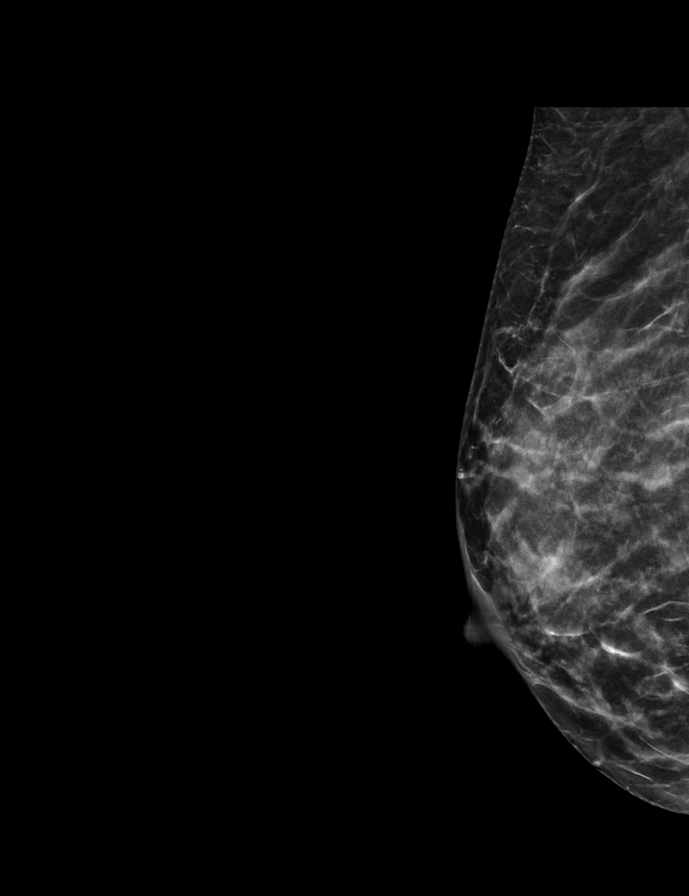

[L MLO synth-2D]
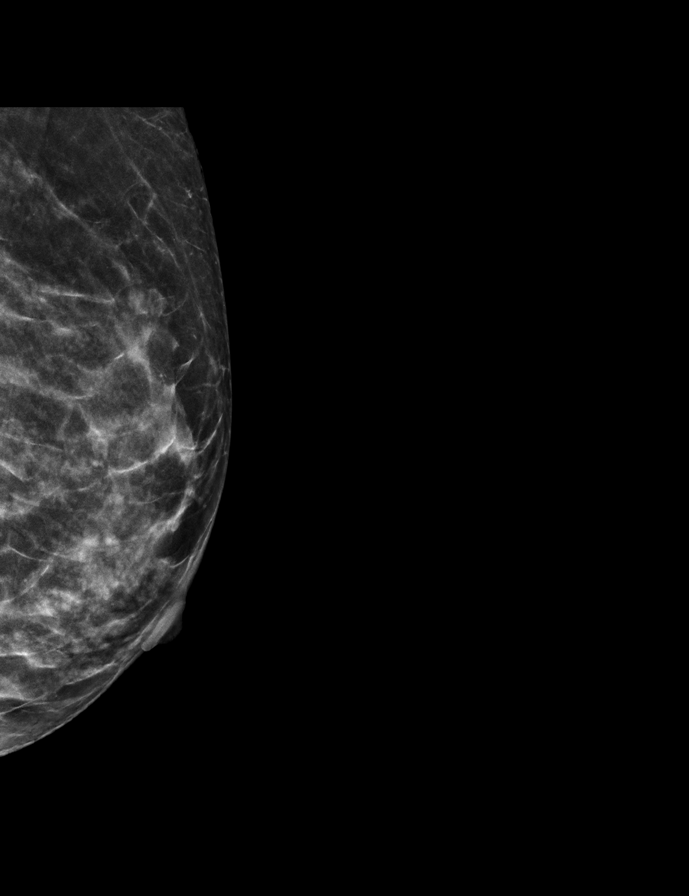

[L CC synth-2D]
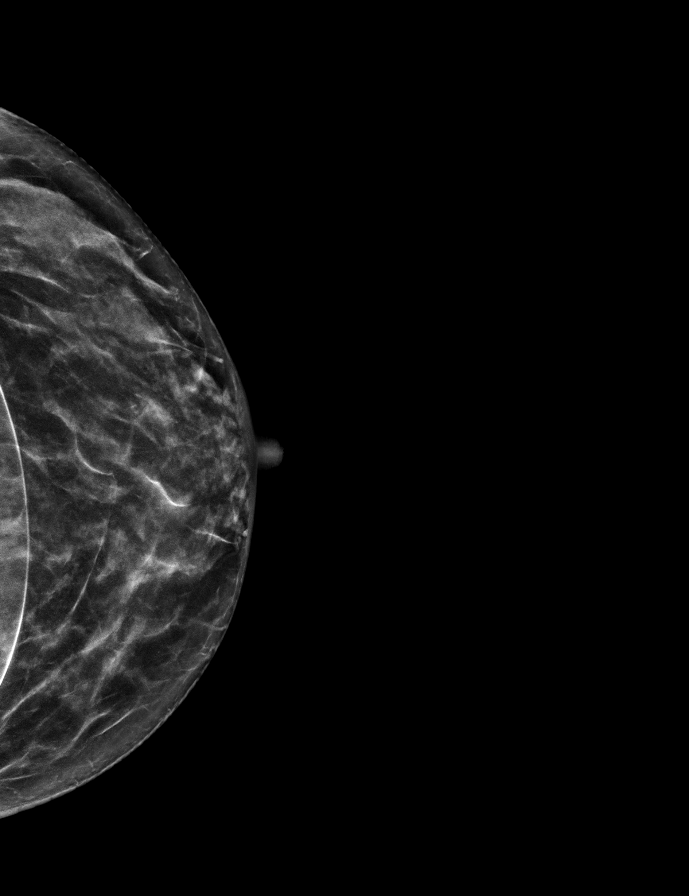

[R CCID BREAST TOMOSYNTHESIS IMAGE tomo · tomo slice 22/43.0]
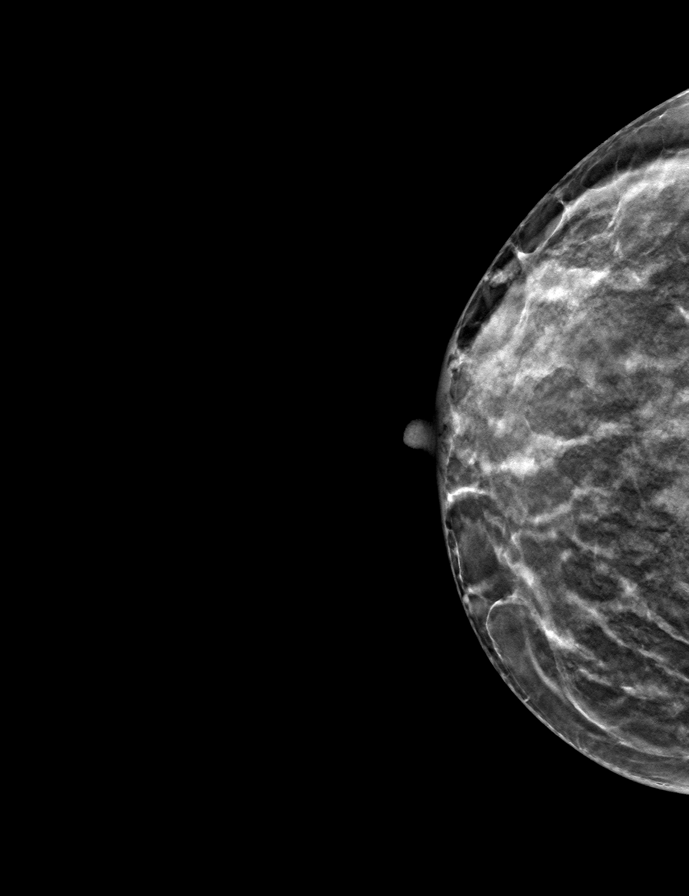

[9 of 28 positions shown; findings below may reference images not displayed]

ACR Breast Density Category c: The breast tissue is heterogeneously
dense, which may obscure small masses.
FINDINGS: There are no new dominant masses, suspicious calcifications or
secondary signs of malignancy within either breast.

Mammographic images were processed with CAD.
IMPRESSION: No evidence of malignancy within either breast.

RECOMMENDATION:
1.  Screening mammogram in one year.(Code:[id number])
2. Benign causes of breast pain, and possible remedies, were
discussed with the patient. Patient was encouraged to follow-up with
referring physician if pain worsened or to return for additional
imaging if a palpable lump/mass developed.

I have discussed the findings and recommendations with the patient.
If applicable, a reminder letter will be sent to the patient
regarding the next appointment.

BI-RADS CATEGORY  1: Negative.

## 2022-10-02 ENCOUNTER — Other Ambulatory Visit: Payer: Self-pay | Admitting: Family Medicine

## 2022-10-02 DIAGNOSIS — R109 Unspecified abdominal pain: Secondary | ICD-10-CM

## 2022-10-15 ENCOUNTER — Ambulatory Visit
Admission: RE | Admit: 2022-10-15 | Discharge: 2022-10-15 | Disposition: A | Discharge status: Home / Self Care | Payer: BC Managed Care – PPO | Source: Ambulatory Visit | Attending: Family Medicine | Admitting: Family Medicine

## 2022-10-15 DIAGNOSIS — R109 Unspecified abdominal pain: Secondary | ICD-10-CM

## 2023-08-13 ENCOUNTER — Other Ambulatory Visit: Payer: Self-pay | Admitting: Family Medicine

## 2023-08-13 DIAGNOSIS — E782 Mixed hyperlipidemia: Secondary | ICD-10-CM

## 2023-09-03 ENCOUNTER — Ambulatory Visit
Admission: RE | Admit: 2023-09-03 | Discharge: 2023-09-03 | Disposition: A | Discharge status: Home / Self Care | Payer: No Typology Code available for payment source | Source: Ambulatory Visit | Attending: Family Medicine | Admitting: Family Medicine

## 2023-09-03 DIAGNOSIS — E782 Mixed hyperlipidemia: Secondary | ICD-10-CM

## 2023-10-31 ENCOUNTER — Other Ambulatory Visit: Payer: Self-pay

## 2023-10-31 ENCOUNTER — Emergency Department (HOSPITAL_BASED_OUTPATIENT_CLINIC_OR_DEPARTMENT_OTHER)
Admission: EM | Admit: 2023-10-31 | Discharge: 2023-10-31 | Disposition: A | Discharge status: Home / Self Care | Payer: BC Managed Care – PPO

## 2023-10-31 ENCOUNTER — Emergency Department (HOSPITAL_BASED_OUTPATIENT_CLINIC_OR_DEPARTMENT_OTHER): Payer: BC Managed Care – PPO | Admitting: Radiology

## 2023-10-31 ENCOUNTER — Other Ambulatory Visit (HOSPITAL_BASED_OUTPATIENT_CLINIC_OR_DEPARTMENT_OTHER): Payer: Self-pay

## 2023-10-31 ENCOUNTER — Encounter (HOSPITAL_BASED_OUTPATIENT_CLINIC_OR_DEPARTMENT_OTHER): Payer: Self-pay | Admitting: Emergency Medicine

## 2023-10-31 DIAGNOSIS — K219 Gastro-esophageal reflux disease without esophagitis: Secondary | ICD-10-CM | POA: Diagnosis not present

## 2023-10-31 DIAGNOSIS — Z20822 Contact with and (suspected) exposure to covid-19: Secondary | ICD-10-CM | POA: Diagnosis not present

## 2023-10-31 DIAGNOSIS — R0789 Other chest pain: Secondary | ICD-10-CM

## 2023-10-31 DIAGNOSIS — R079 Chest pain, unspecified: Secondary | ICD-10-CM | POA: Diagnosis present

## 2023-10-31 LAB — BASIC METABOLIC PANEL
Anion gap: 9 (ref 5–15)
BUN: 7 mg/dL (ref 6–20)
CO2: 31 mmol/L (ref 22–32)
Calcium: 10.1 mg/dL (ref 8.9–10.3)
Chloride: 96 mmol/L — ABNORMAL LOW (ref 98–111)
Creatinine, Ser: 0.81 mg/dL (ref 0.44–1.00)
GFR, Estimated: >60 mL/min (ref >60)
Glucose, Bld: 165 mg/dL — ABNORMAL HIGH (ref 70–99)
Potassium: 4.3 mmol/L (ref 3.5–5.1)
Sodium: 136 mmol/L (ref 135–145)

## 2023-10-31 LAB — CBC
HCT: 33.9 % — ABNORMAL LOW (ref 36.0–46.0)
Hemoglobin: 11 g/dL — ABNORMAL LOW (ref 12.0–15.0)
MCH: 22.3 pg — ABNORMAL LOW (ref 26.0–34.0)
MCHC: 32.4 g/dL (ref 30.0–36.0)
MCV: 68.8 fL — ABNORMAL LOW (ref 80.0–100.0)
Platelets: 430 10*3/uL — ABNORMAL HIGH (ref 150–400)
RBC: 4.93 MIL/uL (ref 3.87–5.11)
RDW: 15.5 % (ref 11.5–15.5)
WBC: 7.1 10*3/uL (ref 4.0–10.5)
nRBC: 0.3 % — ABNORMAL HIGH (ref 0.0–0.2)

## 2023-10-31 LAB — RESP PANEL BY RT-PCR (RSV, FLU A&B, COVID)  RVPGX2
Influenza A by PCR: NEGATIVE
Influenza B by PCR: NEGATIVE
Resp Syncytial Virus by PCR: NEGATIVE
SARS Coronavirus 2 by RT PCR: NEGATIVE

## 2023-10-31 LAB — TROPONIN I (HIGH SENSITIVITY)
Troponin I (High Sensitivity): 2 ng/L (ref <18)
Troponin I (High Sensitivity): 2 ng/L (ref <18)

## 2023-10-31 LAB — PREGNANCY, URINE: Preg Test, Ur: NEGATIVE

## 2023-10-31 MED ORDER — LIDOCAINE VISCOUS HCL 2 % MT SOLN
15.0000 mL | Freq: Once | OROMUCOSAL | Status: AC
  Administered 2023-10-31: 15 mL via ORAL
  Filled 2023-10-31: qty 15

## 2023-10-31 MED ORDER — ALUM & MAG HYDROXIDE-SIMETH 200-200-20 MG/5ML PO SUSP
30.0000 mL | Freq: Once | ORAL | Status: AC
  Administered 2023-10-31: 30 mL via ORAL
  Filled 2023-10-31: qty 30

## 2023-10-31 MED ORDER — FAMOTIDINE 20 MG PO TABS
20.0000 mg | ORAL_TABLET | Freq: Once | ORAL | Status: AC
  Administered 2023-10-31: 20 mg via ORAL
  Filled 2023-10-31: qty 1

## 2023-10-31 MED ORDER — ESOMEPRAZOLE MAGNESIUM 20 MG PO CPDR
20.0000 mg | DELAYED_RELEASE_CAPSULE | Freq: Every day | ORAL | 0 refills | Status: AC
  Filled 2023-10-31 (×2): qty 15, 15d supply, fill #0

## 2023-10-31 NOTE — ED Triage Notes (Addendum)
Pt came home from vacation yesterday and started feeling dizzy with indigestion and nausea. Also c/ L upper chest pain 3/10, improving today. Went to UC today, EKG done and sent here for further testing.

## 2023-10-31 NOTE — Discharge Instructions (Signed)
You were seen in the ER today for concerns of chest pain. Your labs, imaging, and EKG were thankfully reassuring without any clear signs that this was cardiac related pain. You had some relief with acid reflux medications which may indicate that this is uncontrolled so I have sent a prescription for omeprazole to your pharmacy to take for the next 2 weeks. Please follow up with your primary care provider for further evaluation. If new or worsening symptoms develop, return to the ER.

## 2023-10-31 NOTE — ED Provider Notes (Signed)
  EMERGENCY DEPARTMENT AT [facility] Provider Note   CSN: [id number] Arrival date & time: 10/31/23  1244     History Chief Complaint  Patient presents with   Chest Pain   Dizziness   Nausea    Susan Olson is a 43 y.o. female.  Patient with a history of hypothyroidism, anxiety, beta thalassemia presents the emergency department concerns of chest pain, nausea, dizziness.  States that she returned back from vacation yesterday and began to feel some level of dizziness and indigestion as well as nausea.  Denies any vomiting.  The chest pain is primarily in the left upper chest and rates 3 out of 10.  This has been improving.  Went to urgent care earlier today for evaluation and was advised to come to the emergency department for further testing.  EKG unremarkable from urgent care.  She denies any diaphoresis during episode of chest pain.  Has not take any medications due to a lactose intolerance issue and states that her PCP attempted to send medications although these would not work for her due to containing lactose.   Chest Pain Associated symptoms: dizziness   Dizziness Associated symptoms: chest pain        Home Medications Prior to Admission medications   Medication Sig Start Date End Date Taking? Authorizing Provider  omeprazole (PRILOSEC) 20 MG capsule Take 1 capsule (20 mg total) by mouth daily for 15 days. 10/31/23 11/15/23 Yes Smitty Knudsen, PA-C  azithromycin (ZITHROMAX) 250 MG tablet 2 by mouth day one followed by 1 by mouth days 2 through 5 for ear infection 09/29/15   Margaree Mackintosh, MD  escitalopram (LEXAPRO) 10 MG tablet  05/27/14   [provider]  Prenatal Vit-Fe Fumarate-FA (PRENATAL MULTIVITAMIN) TABS tablet Take 1 tablet by mouth daily at 12 noon.    [provider]  SYNTHROID 125 MCG tablet  06/29/15   [provider]      Allergies    Lactose intolerance (gi)    Review of Systems   Review of Systems   Cardiovascular:  Positive for chest pain.  Neurological:  Positive for dizziness.  All other systems reviewed and are negative.   Physical Exam Updated Vital Signs BP 126/86   Pulse 70   Temp 98.5 F (36.9 C)   Resp 18   Ht 5\' 4"  (1.626 m)   Wt 63.5 kg   LMP 10/25/2023 (Exact Date)   SpO2 100%   BMI 24.03 kg/m  Physical Exam Vitals and nursing note reviewed.  Constitutional:      General: She is not in acute distress.    Appearance: She is well-developed.  HENT:     Head: Normocephalic and atraumatic.  Eyes:     Conjunctiva/sclera: Conjunctivae normal.  Cardiovascular:     Rate and Rhythm: Normal rate and regular rhythm.     Heart sounds: No murmur heard. Pulmonary:     Effort: Pulmonary effort is normal. No respiratory distress.     Breath sounds: Normal breath sounds. No decreased breath sounds, wheezing or rhonchi.  Abdominal:     Palpations: Abdomen is soft.     Tenderness: There is no abdominal tenderness.  Musculoskeletal:        General: No swelling.     Cervical back: Neck supple.  Skin:    General: Skin is warm and dry.     Capillary Refill: Capillary refill takes less than 2 seconds.  Neurological:     Mental Status: She  is alert.  Psychiatric:        Mood and Affect: Mood normal.     ED Results / Procedures / Treatments   Labs (all labs ordered are listed, but only abnormal results are displayed) Labs Reviewed  BASIC METABOLIC PANEL - Abnormal; Notable for the following components:      Result Value   Chloride 96 (*)    Glucose, Bld 165 (*)    All other components within normal limits  CBC - Abnormal; Notable for the following components:   Hemoglobin 11.0 (*)    HCT 33.9 (*)    MCV 68.8 (*)    MCH 22.3 (*)    Platelets 430 (*)    nRBC 0.3 (*)    All other components within normal limits  RESP PANEL BY RT-PCR (RSV, FLU A&B, COVID)  RVPGX2  PREGNANCY, URINE  TROPONIN I (HIGH SENSITIVITY)  TROPONIN I (HIGH SENSITIVITY)    EKG EKG  Interpretation Date/Time:  Friday October 31 2023 13:05:17 EST Ventricular Rate:  72 PR Interval:  140 QRS Duration:  80 QT Interval:  412 QTC Calculation: 451 R Axis:   66  Text Interpretation: Normal sinus rhythm Normal ECG No previous ECGs available Confirmed by Beckey Downing [phone]) on 10/31/2023 1:44:04 PM  Radiology DG Chest 2 View Result Date: 10/31/2023 CLINICAL DATA:  Chest pain. EXAM: CHEST - 2 VIEW COMPARISON:  09/03/2023.  08/29/2008. FINDINGS: The heart size and mediastinal contours are within normal limits. No focal consolidation, pleural effusion, or pneumothorax. No acute osseous abnormality. IMPRESSION: No acute cardiopulmonary findings. Electronically Signed   By: Hart Robinsons M.D.   On: 10/31/2023 14:03    Procedures Procedures    Medications Ordered in ED Medications  famotidine (PEPCID) tablet 20 mg (20 mg Oral Given 10/31/23 1518)  alum & mag hydroxide-simeth (MAALOX/MYLANTA) 200-200-20 MG/5ML suspension 30 mL (30 mLs Oral Given 10/31/23 1518)    And  lidocaine (XYLOCAINE) 2 % viscous mouth solution 15 mL (15 mLs Oral Given 10/31/23 1518)    ED Course/ Medical Decision Making/ A&P                                 Medical Decision Making Amount and/or Complexity of Data Reviewed Labs: ordered. Radiology: ordered.  Risk OTC drugs. Prescription drug management.   This patient presents to the ED for concern of chest pain, nausea, dizziness.  Differential diagnosis includes viral URI, pancreatitis, ACS, gastroenteritis   Lab Tests:  I Ordered, and personally interpreted labs.  The pertinent results include: CBC with no notable anemia close to baseline, BMP unremarkable with no evidence of acute dehydration or kidney injury, urine pregnancy negative, troponin negative at 2, delta troponin 2, respiratory viral panel negative for COVID, influenza, and RSV   Imaging Studies ordered:  I ordered imaging studies including chest x-ray I independently  visualized and interpreted imaging which showed no acute cardiopulmonary process seen I agree with the radiologist interpretation   Medicines ordered and prescription drug management:  I ordered medication including Maalox, viscous lidocaine, famotidine for GI cocktail Reevaluation of the patient after these medicines showed that the patient improved I have reviewed the patients home medicines and have made adjustments as needed   Problem List / ED Course:  Patient with past history of hypothyroidism, anxiety, beta thalassemia presents ED with concerns of chest pain, nausea and dizziness.  States that the symptoms began yesterday after patient returned home  from a vacation.  Denies any sick contacts as far she is aware.  No vomiting or diarrhea.  Was seen urgent care earlier today advised to come to the emergency department.  EKG performed at urgent care which was reassuring.  No prior cardiac history.  No significant history of vertigo and states that her PCP try to send in some medications for her but was unable to take these due to some the medications containing lactose. On exam, patient is well-appearing.  No observed nystagmus.  No significant abdominal tenderness.  Lung and heart sounds unremarkable. I suspect this may possibly be due to gastritis versus vertigo.  Will need to rule out ACS due to onset of chest pain although this may be secondary to poorly controlled reflux.  Will attempt to treat with GI cocktail. Repeat troponin flat at 2. Respiratory panel negative. Reassessed patient and she appeared to have some improvement in symptoms with GI cocktail. Suspect this may be reflux mediated chest pain and will start on course of PPI at home for symptom control.  Advised patient to follow up with PCP for further evaluation. Strict return precautions discussed. Discharged home in stable condition.  Final Clinical Impression(s) / ED Diagnoses Final diagnoses:  Other chest pain   Gastroesophageal reflux disease, unspecified whether esophagitis present    Rx / DC Orders ED Discharge Orders          Ordered    omeprazole (PRILOSEC) 20 MG capsule  Daily        10/31/23 1713              Smitty Knudsen, PA-C 10/31/23 1717    Durwin Glaze, MD 11/01/23 [phone]

## 2023-11-18 ENCOUNTER — Other Ambulatory Visit (HOSPITAL_BASED_OUTPATIENT_CLINIC_OR_DEPARTMENT_OTHER): Payer: Self-pay
